# Patient Record
Sex: Male | Born: 1935
Health system: Southern US, Community
[De-identification: ages and names within clinical notes are randomized; demographics above are authoritative.]

---

## 2014-07-19 ENCOUNTER — Other Ambulatory Visit: Payer: Self-pay | Admitting: Urology

## 2014-07-19 DIAGNOSIS — C641 Malignant neoplasm of right kidney, except renal pelvis: Secondary | ICD-10-CM

## 2014-08-01 ENCOUNTER — Inpatient Hospital Stay: Admission: RE | Admit: 2014-08-01 | Payer: Self-pay | Source: Ambulatory Visit

## 2014-08-01 ENCOUNTER — Other Ambulatory Visit: Payer: Self-pay | Admitting: Urology

## 2014-08-01 ENCOUNTER — Ambulatory Visit
Admission: RE | Admit: 2014-08-01 | Discharge: 2014-08-01 | Disposition: A | Discharge status: Home / Self Care | Payer: Medicare Other | Source: Ambulatory Visit | Attending: Urology | Admitting: Urology

## 2014-08-01 DIAGNOSIS — C641 Malignant neoplasm of right kidney, except renal pelvis: Secondary | ICD-10-CM

## 2015-08-05 ENCOUNTER — Other Ambulatory Visit: Payer: Self-pay | Admitting: Urology

## 2015-08-05 DIAGNOSIS — N289 Disorder of kidney and ureter, unspecified: Secondary | ICD-10-CM

## 2015-08-11 ENCOUNTER — Ambulatory Visit
Admission: RE | Admit: 2015-08-11 | Discharge: 2015-08-11 | Disposition: A | Discharge status: Home / Self Care | Payer: Medicare Other | Source: Ambulatory Visit | Attending: Urology | Admitting: Urology

## 2015-08-11 DIAGNOSIS — N289 Disorder of kidney and ureter, unspecified: Secondary | ICD-10-CM

## 2016-03-01 DIAGNOSIS — N184 Chronic kidney disease, stage 4 (severe): Secondary | ICD-10-CM | POA: Diagnosis not present

## 2016-03-01 DIAGNOSIS — Z85528 Personal history of other malignant neoplasm of kidney: Secondary | ICD-10-CM | POA: Diagnosis not present

## 2016-03-01 DIAGNOSIS — G2581 Restless legs syndrome: Secondary | ICD-10-CM | POA: Diagnosis not present

## 2016-03-01 DIAGNOSIS — N401 Enlarged prostate with lower urinary tract symptoms: Secondary | ICD-10-CM | POA: Diagnosis not present

## 2016-03-01 DIAGNOSIS — Z9181 History of falling: Secondary | ICD-10-CM | POA: Diagnosis not present

## 2016-03-01 DIAGNOSIS — I10 Essential (primary) hypertension: Secondary | ICD-10-CM | POA: Diagnosis not present

## 2016-08-05 DIAGNOSIS — N184 Chronic kidney disease, stage 4 (severe): Secondary | ICD-10-CM | POA: Diagnosis not present

## 2016-08-05 DIAGNOSIS — Z0000 Encounter for general adult medical examination without abnormal findings: Secondary | ICD-10-CM | POA: Diagnosis not present

## 2016-08-05 DIAGNOSIS — Z79899 Other long term (current) drug therapy: Secondary | ICD-10-CM | POA: Diagnosis not present

## 2016-08-05 DIAGNOSIS — Z6822 Body mass index (BMI) 22.0-22.9, adult: Secondary | ICD-10-CM | POA: Diagnosis not present

## 2016-08-09 DIAGNOSIS — N3020 Other chronic cystitis without hematuria: Secondary | ICD-10-CM | POA: Diagnosis not present

## 2016-08-09 DIAGNOSIS — N401 Enlarged prostate with lower urinary tract symptoms: Secondary | ICD-10-CM | POA: Diagnosis not present

## 2016-08-09 DIAGNOSIS — N318 Other neuromuscular dysfunction of bladder: Secondary | ICD-10-CM | POA: Diagnosis not present

## 2016-08-09 DIAGNOSIS — R351 Nocturia: Secondary | ICD-10-CM | POA: Diagnosis not present

## 2017-02-16 DIAGNOSIS — J069 Acute upper respiratory infection, unspecified: Secondary | ICD-10-CM | POA: Diagnosis not present

## 2017-02-16 DIAGNOSIS — Z6822 Body mass index (BMI) 22.0-22.9, adult: Secondary | ICD-10-CM | POA: Diagnosis not present

## 2017-02-16 DIAGNOSIS — Z1339 Encounter for screening examination for other mental health and behavioral disorders: Secondary | ICD-10-CM | POA: Diagnosis not present

## 2017-02-25 DIAGNOSIS — E785 Hyperlipidemia, unspecified: Secondary | ICD-10-CM | POA: Diagnosis not present

## 2017-02-25 DIAGNOSIS — Z760 Encounter for issue of repeat prescription: Secondary | ICD-10-CM | POA: Diagnosis not present

## 2017-02-25 DIAGNOSIS — G2581 Restless legs syndrome: Secondary | ICD-10-CM | POA: Diagnosis not present

## 2017-02-25 DIAGNOSIS — Z6822 Body mass index (BMI) 22.0-22.9, adult: Secondary | ICD-10-CM | POA: Diagnosis not present

## 2017-02-25 DIAGNOSIS — I10 Essential (primary) hypertension: Secondary | ICD-10-CM | POA: Diagnosis not present

## 2017-02-25 DIAGNOSIS — N401 Enlarged prostate with lower urinary tract symptoms: Secondary | ICD-10-CM | POA: Diagnosis not present

## 2017-02-25 DIAGNOSIS — Z79899 Other long term (current) drug therapy: Secondary | ICD-10-CM | POA: Diagnosis not present

## 2017-02-25 DIAGNOSIS — N184 Chronic kidney disease, stage 4 (severe): Secondary | ICD-10-CM | POA: Diagnosis not present

## 2017-03-27 DIAGNOSIS — N400 Enlarged prostate without lower urinary tract symptoms: Secondary | ICD-10-CM | POA: Diagnosis not present

## 2017-05-09 DIAGNOSIS — N401 Enlarged prostate with lower urinary tract symptoms: Secondary | ICD-10-CM | POA: Diagnosis not present

## 2017-05-09 DIAGNOSIS — G2581 Restless legs syndrome: Secondary | ICD-10-CM | POA: Diagnosis not present

## 2017-05-09 DIAGNOSIS — Z9181 History of falling: Secondary | ICD-10-CM | POA: Diagnosis not present

## 2017-05-09 DIAGNOSIS — Z85528 Personal history of other malignant neoplasm of kidney: Secondary | ICD-10-CM | POA: Diagnosis not present

## 2017-05-09 DIAGNOSIS — N184 Chronic kidney disease, stage 4 (severe): Secondary | ICD-10-CM | POA: Diagnosis not present

## 2017-05-09 DIAGNOSIS — Z1331 Encounter for screening for depression: Secondary | ICD-10-CM | POA: Diagnosis not present

## 2017-05-09 DIAGNOSIS — I10 Essential (primary) hypertension: Secondary | ICD-10-CM | POA: Diagnosis not present

## 2017-08-09 DIAGNOSIS — R351 Nocturia: Secondary | ICD-10-CM | POA: Diagnosis not present

## 2017-08-09 DIAGNOSIS — N318 Other neuromuscular dysfunction of bladder: Secondary | ICD-10-CM | POA: Diagnosis not present

## 2017-08-09 DIAGNOSIS — N3020 Other chronic cystitis without hematuria: Secondary | ICD-10-CM | POA: Diagnosis not present

## 2017-08-09 DIAGNOSIS — N401 Enlarged prostate with lower urinary tract symptoms: Secondary | ICD-10-CM | POA: Diagnosis not present

## 2017-08-25 DIAGNOSIS — R351 Nocturia: Secondary | ICD-10-CM | POA: Diagnosis not present

## 2017-08-25 DIAGNOSIS — N401 Enlarged prostate with lower urinary tract symptoms: Secondary | ICD-10-CM | POA: Diagnosis not present

## 2017-09-08 DIAGNOSIS — N401 Enlarged prostate with lower urinary tract symptoms: Secondary | ICD-10-CM | POA: Diagnosis not present

## 2017-09-08 DIAGNOSIS — R351 Nocturia: Secondary | ICD-10-CM | POA: Diagnosis not present

## 2017-09-08 DIAGNOSIS — N318 Other neuromuscular dysfunction of bladder: Secondary | ICD-10-CM | POA: Diagnosis not present

## 2017-09-08 DIAGNOSIS — N3020 Other chronic cystitis without hematuria: Secondary | ICD-10-CM | POA: Diagnosis not present

## 2017-10-10 IMAGING — MR MR ABDOMEN W/O CM
8 series · 48 of 48 positions shown · non-contrast
Comparison: MRI of the abdomen 08/01/2014.

CLINICAL DATA: 88-year-old male with history of right renal lesion.
Prior history of left-sided renal cell carcinoma status post left
nephrectomy in April 2013.

EXAM:
MRI ABDOMEN WITHOUT CONTRAST
TECHNIQUE: Multiplanar multisequence MR imaging was performed without the
administration of intravenous contrast.

[Series 3: T2 · coronal · 5.0mm · 1.56mm/px · 3 of 34 slices shown (1 of 3)]
[im 1/34]
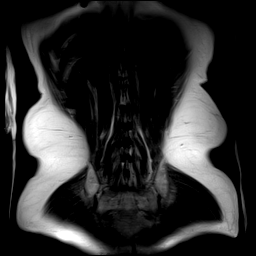
[im 17/34]
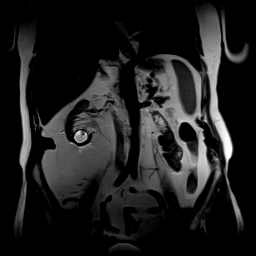
[im 34/34]
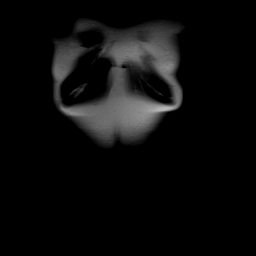

[Series 5: T2 · axial · 5.0mm · 1.48mm/px · z∈[-21,+177]mm · 3 of 34 slices shown (2 of 3)]
[im 1/34]
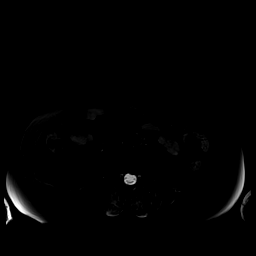
[im 17/34]
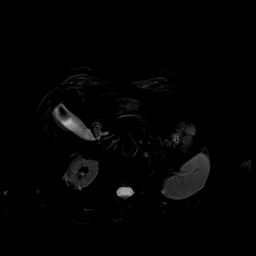
[im 34/34]
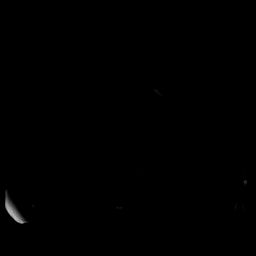

[Series 6: DWI · axial · 5.0mm · 1.42mm/px · z∈[-35,+187]mm · 11 of 114 slices shown (1 of 2)]
[im 1/114]
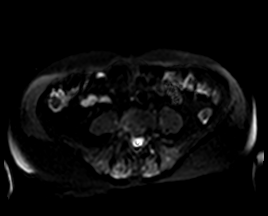
[im 12/114]
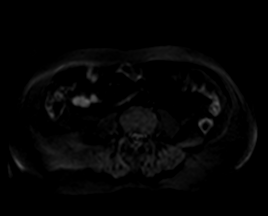
[im 23/114]
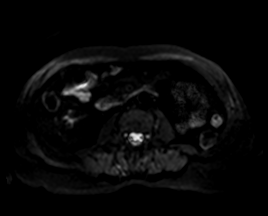
[im 34/114]
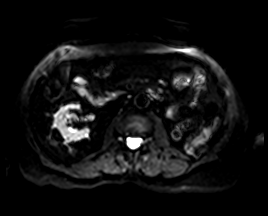
[im 46/114]
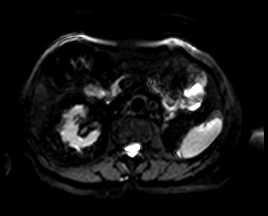
[im 57/114]
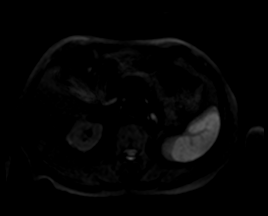
[im 68/114]
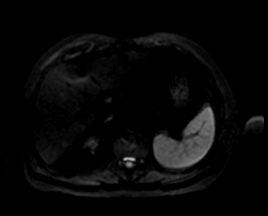
[im 80/114]
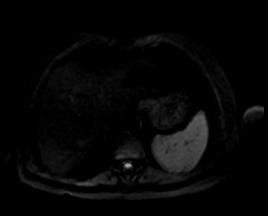
[im 91/114]
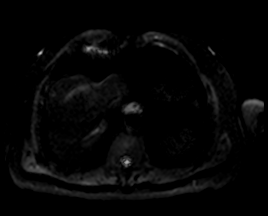
[im 102/114]
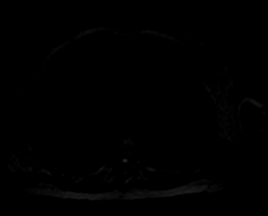
[im 114/114]
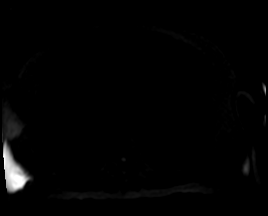

[Series 7: DWI · axial · 5.0mm · 1.42mm/px · z∈[-35,+187]mm · 4 of 38 slices shown (2 of 2)]
[im 1/38]
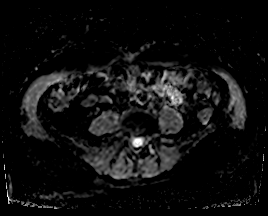
[im 13/38]
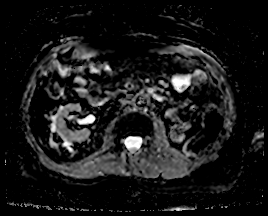
[im 25/38]
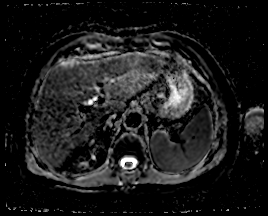
[im 38/38]
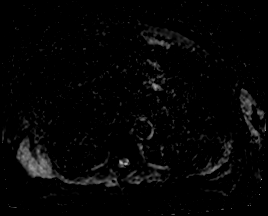

[Series 8: T2 · axial · 6.0mm · 1.22mm/px · z∈[-42,+167]mm · 3 of 30 slices shown (3 of 3)]
[im 1/30]
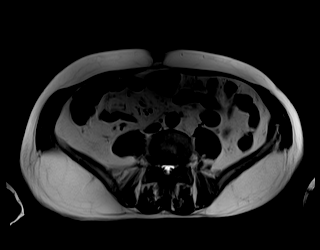
[im 15/30]
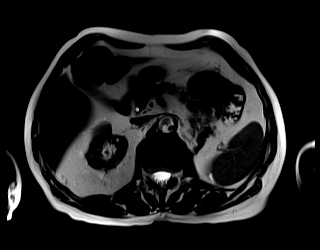
[im 30/30]
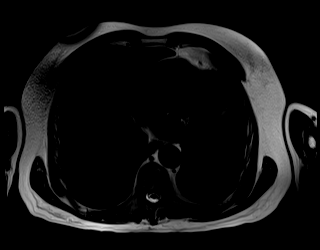

[Series 10: bSSFP · axial · 5.0mm · 1.25mm/px · z∈[-43,+179]mm · 4 of 38 slices shown]
[im 1/38]
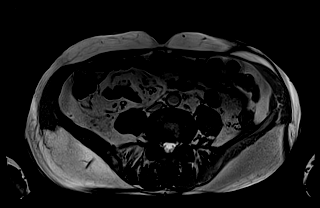
[im 13/38]
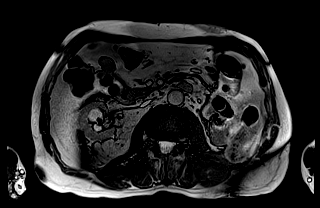
[im 25/38]
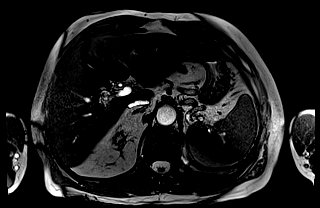
[im 38/38]
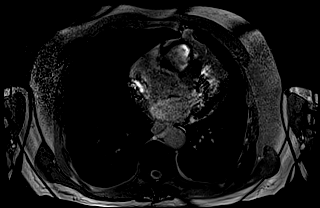

[Series 11: T1 dynamic · axial · non-contrast · 3.0mm · 1.25mm/px · z∈[-44,+169]mm · 7 of 72 slices shown]
[im 1/72]
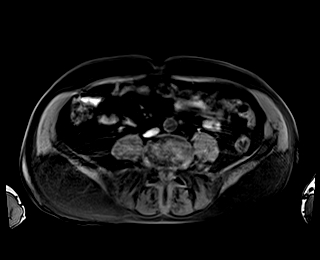
[im 12/72]
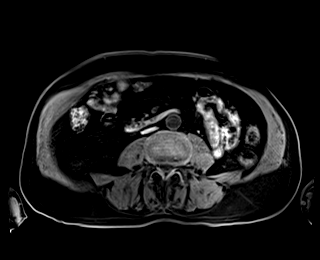
[im 24/72]
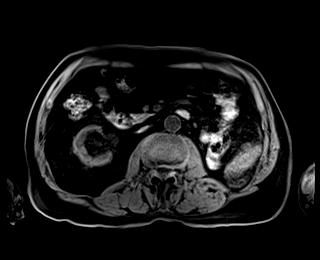
[im 36/72]
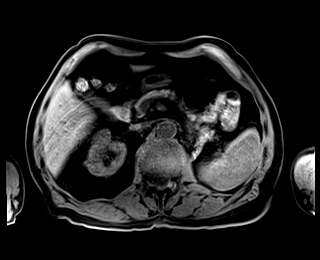
[im 48/72]
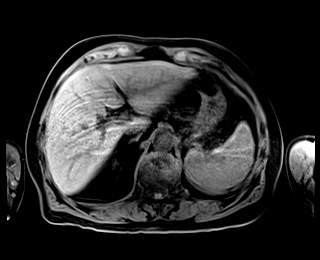
[im 60/72]
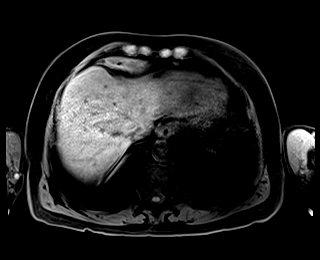
[im 72/72]
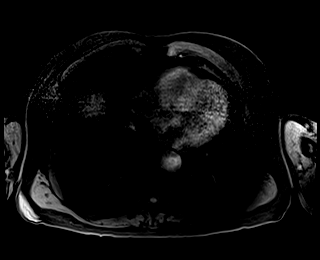

[Series 12: T1 · axial · 3.0mm · 1.12mm/px · z∈[-27,+162]mm · 13 of 128 slices shown]
[im 1/128]
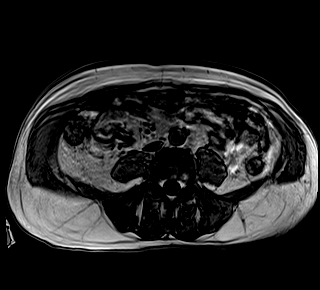
[im 11/128]
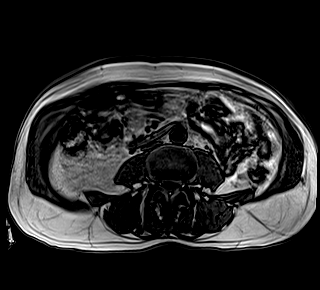
[im 22/128]
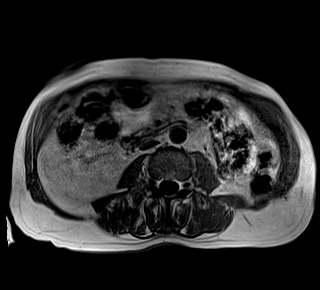
[im 32/128]
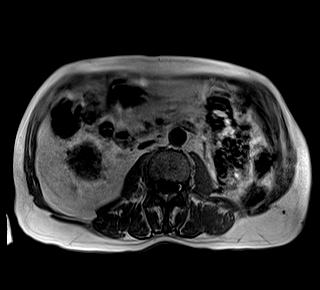
[im 43/128]
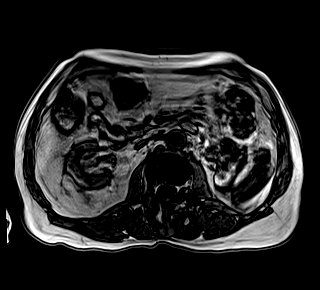
[im 53/128]
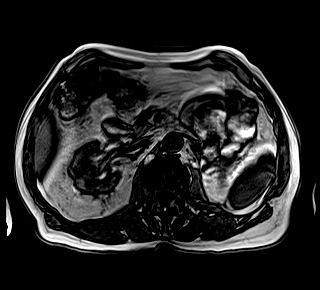
[im 64/128]
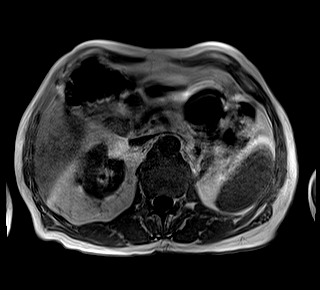
[im 75/128]
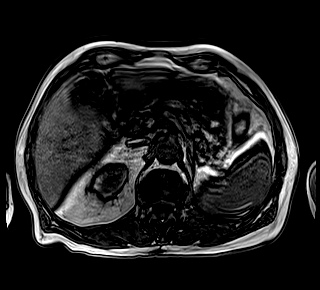
[im 85/128]
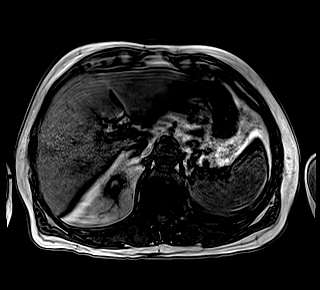
[im 96/128]
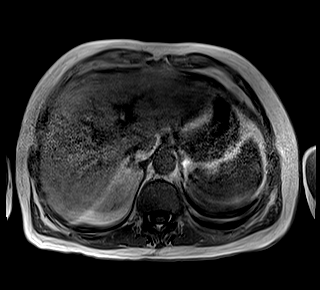
[im 106/128]
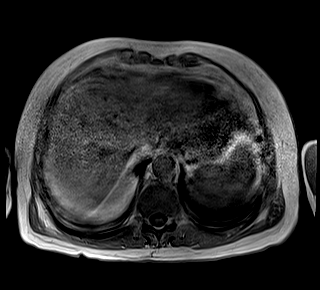
[im 117/128]
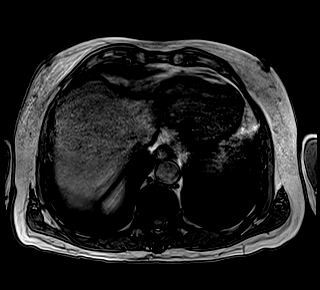
[im 128/128]
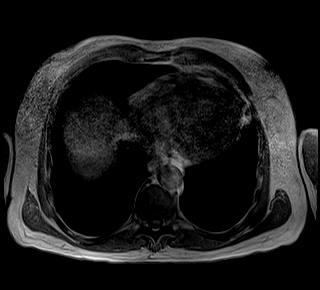

[48 of 48 positions shown; findings below may reference images not displayed]

FINDINGS: Comment: Today's study is limited for detection and characterization
of visceral and/or vascular lesions by lack of IV gadolinium.

Lower chest:  Visualized portions are unremarkable.

Hepatobiliary: No definite cystic or solid hepatic lesions are
identified on today's noncontrast examination. No intra or
extrahepatic biliary ductal dilatation. Gallbladder is normal in
appearance.

Pancreas: No definite pancreatic mass or peripancreatic inflammatory
changes on today's noncontrast MRI examination.

Spleen: Unremarkable.

Adrenals/Urinary Tract: Status post left nephrectomy. No abnormal
soft tissue mass noted in the left nephrectomy bed to suggest local
recurrence of disease. The lesion of concern in the interpolar
region of the right kidney is stable in size measuring 12 mm, and
remains isointense on T2 weighted images and hyperintense on T1
weighted images, presumably a proteinaceous/hemorrhagic cyst. There
is an additional complex cystic lesion in the lower pole of the
right kidney which currently measures 3.3 x 2.1 x 2.0 cm (image 22
of series 8 and image 17 of series 3), which has an internal
septation, as well as some internal nodularity which measures 10 mm
(image 21 of series 8). Overall, this lesion is stable in size and
appearance compared to the prior study. No right-sided
hydroureteronephrosis in the visualized abdomen. Bilateral adrenal
glands are normal in appearance.

Stomach/Bowel: Visualized portions are unremarkable.

Vascular/Lymphatic: No aneurysm identified in the visualized
abdominal vasculature. No lymphadenopathy noted in the abdomen.

Other: No significant volume of ascites in the visualized peritoneal
cavity.

Musculoskeletal: No definite aggressive osseous lesions are
identified in the visualized portions of the abdomen.
IMPRESSION: 1. Previously identified right renal lesions are stable in size and
appearance compared to the prior examination 08/01/2014, favored to
be benign, as detailed above. This includes a
proteinaceous/hemorrhagic cyst in the interpolar region, and a
complex cystic lesion in the lower pole.
2. Status post left nephrectomy. No findings to suggest local
recurrence of disease.

## 2018-01-07 DIAGNOSIS — I10 Essential (primary) hypertension: Secondary | ICD-10-CM | POA: Diagnosis not present

## 2018-01-07 DIAGNOSIS — N184 Chronic kidney disease, stage 4 (severe): Secondary | ICD-10-CM | POA: Diagnosis not present

## 2018-01-07 DIAGNOSIS — E785 Hyperlipidemia, unspecified: Secondary | ICD-10-CM | POA: Diagnosis not present

## 2018-02-07 DIAGNOSIS — I10 Essential (primary) hypertension: Secondary | ICD-10-CM | POA: Diagnosis not present

## 2018-02-07 DIAGNOSIS — E785 Hyperlipidemia, unspecified: Secondary | ICD-10-CM | POA: Diagnosis not present

## 2018-03-09 DIAGNOSIS — N184 Chronic kidney disease, stage 4 (severe): Secondary | ICD-10-CM | POA: Diagnosis not present

## 2018-03-09 DIAGNOSIS — E785 Hyperlipidemia, unspecified: Secondary | ICD-10-CM | POA: Diagnosis not present

## 2018-03-09 DIAGNOSIS — I10 Essential (primary) hypertension: Secondary | ICD-10-CM | POA: Diagnosis not present

## 2018-04-07 DIAGNOSIS — N401 Enlarged prostate with lower urinary tract symptoms: Secondary | ICD-10-CM | POA: Diagnosis not present

## 2018-04-07 DIAGNOSIS — N3020 Other chronic cystitis without hematuria: Secondary | ICD-10-CM | POA: Diagnosis not present

## 2018-04-07 DIAGNOSIS — R351 Nocturia: Secondary | ICD-10-CM | POA: Diagnosis not present

## 2018-04-07 DIAGNOSIS — N318 Other neuromuscular dysfunction of bladder: Secondary | ICD-10-CM | POA: Diagnosis not present

## 2018-04-08 DIAGNOSIS — E785 Hyperlipidemia, unspecified: Secondary | ICD-10-CM | POA: Diagnosis not present

## 2018-04-08 DIAGNOSIS — I10 Essential (primary) hypertension: Secondary | ICD-10-CM | POA: Diagnosis not present

## 2018-05-02 DIAGNOSIS — J0100 Acute maxillary sinusitis, unspecified: Secondary | ICD-10-CM | POA: Diagnosis not present

## 2018-05-02 DIAGNOSIS — J209 Acute bronchitis, unspecified: Secondary | ICD-10-CM | POA: Diagnosis not present

## 2018-05-09 DIAGNOSIS — I10 Essential (primary) hypertension: Secondary | ICD-10-CM | POA: Diagnosis not present

## 2018-05-09 DIAGNOSIS — E785 Hyperlipidemia, unspecified: Secondary | ICD-10-CM | POA: Diagnosis not present

## 2018-06-15 DIAGNOSIS — N184 Chronic kidney disease, stage 4 (severe): Secondary | ICD-10-CM | POA: Diagnosis not present

## 2018-06-15 DIAGNOSIS — N401 Enlarged prostate with lower urinary tract symptoms: Secondary | ICD-10-CM | POA: Diagnosis not present

## 2018-06-15 DIAGNOSIS — Z6821 Body mass index (BMI) 21.0-21.9, adult: Secondary | ICD-10-CM | POA: Diagnosis not present

## 2018-06-26 DIAGNOSIS — K2970 Gastritis, unspecified, without bleeding: Secondary | ICD-10-CM | POA: Diagnosis not present

## 2018-06-26 DIAGNOSIS — Z6821 Body mass index (BMI) 21.0-21.9, adult: Secondary | ICD-10-CM | POA: Diagnosis not present

## 2018-06-26 DIAGNOSIS — Z85528 Personal history of other malignant neoplasm of kidney: Secondary | ICD-10-CM | POA: Diagnosis not present

## 2018-06-26 DIAGNOSIS — N184 Chronic kidney disease, stage 4 (severe): Secondary | ICD-10-CM | POA: Diagnosis not present

## 2018-06-26 DIAGNOSIS — I10 Essential (primary) hypertension: Secondary | ICD-10-CM | POA: Diagnosis not present

## 2018-07-11 DIAGNOSIS — I10 Essential (primary) hypertension: Secondary | ICD-10-CM | POA: Diagnosis not present

## 2018-07-11 DIAGNOSIS — I951 Orthostatic hypotension: Secondary | ICD-10-CM | POA: Diagnosis not present

## 2018-07-11 DIAGNOSIS — R51 Headache: Secondary | ICD-10-CM | POA: Diagnosis not present

## 2018-07-11 DIAGNOSIS — M542 Cervicalgia: Secondary | ICD-10-CM | POA: Diagnosis not present

## 2018-07-11 DIAGNOSIS — R296 Repeated falls: Secondary | ICD-10-CM | POA: Diagnosis not present

## 2018-07-18 DIAGNOSIS — I700 Atherosclerosis of aorta: Secondary | ICD-10-CM | POA: Diagnosis not present

## 2018-07-18 DIAGNOSIS — I10 Essential (primary) hypertension: Secondary | ICD-10-CM | POA: Diagnosis not present

## 2018-07-18 DIAGNOSIS — D692 Other nonthrombocytopenic purpura: Secondary | ICD-10-CM | POA: Diagnosis not present

## 2018-07-18 DIAGNOSIS — K219 Gastro-esophageal reflux disease without esophagitis: Secondary | ICD-10-CM | POA: Diagnosis not present

## 2018-07-18 DIAGNOSIS — Z6821 Body mass index (BMI) 21.0-21.9, adult: Secondary | ICD-10-CM | POA: Diagnosis not present

## 2018-07-18 DIAGNOSIS — Z0000 Encounter for general adult medical examination without abnormal findings: Secondary | ICD-10-CM | POA: Diagnosis not present

## 2018-07-18 DIAGNOSIS — N401 Enlarged prostate with lower urinary tract symptoms: Secondary | ICD-10-CM | POA: Diagnosis not present

## 2018-07-18 DIAGNOSIS — Z85528 Personal history of other malignant neoplasm of kidney: Secondary | ICD-10-CM | POA: Diagnosis not present

## 2018-07-18 DIAGNOSIS — G2581 Restless legs syndrome: Secondary | ICD-10-CM | POA: Diagnosis not present

## 2018-07-18 DIAGNOSIS — N184 Chronic kidney disease, stage 4 (severe): Secondary | ICD-10-CM | POA: Diagnosis not present

## 2018-07-18 DIAGNOSIS — E785 Hyperlipidemia, unspecified: Secondary | ICD-10-CM | POA: Diagnosis not present

## 2018-08-15 DIAGNOSIS — Z6821 Body mass index (BMI) 21.0-21.9, adult: Secondary | ICD-10-CM | POA: Diagnosis not present

## 2018-08-15 DIAGNOSIS — N184 Chronic kidney disease, stage 4 (severe): Secondary | ICD-10-CM | POA: Diagnosis not present

## 2018-08-15 DIAGNOSIS — R404 Transient alteration of awareness: Secondary | ICD-10-CM | POA: Diagnosis not present

## 2018-08-15 DIAGNOSIS — R4181 Age-related cognitive decline: Secondary | ICD-10-CM | POA: Diagnosis not present

## 2018-08-15 DIAGNOSIS — R64 Cachexia: Secondary | ICD-10-CM | POA: Diagnosis not present

## 2018-08-15 DIAGNOSIS — I10 Essential (primary) hypertension: Secondary | ICD-10-CM | POA: Diagnosis not present

## 2018-08-15 DIAGNOSIS — R739 Hyperglycemia, unspecified: Secondary | ICD-10-CM | POA: Diagnosis not present

## 2018-08-15 DIAGNOSIS — R4586 Emotional lability: Secondary | ICD-10-CM | POA: Diagnosis not present

## 2018-11-08 DIAGNOSIS — K219 Gastro-esophageal reflux disease without esophagitis: Secondary | ICD-10-CM | POA: Diagnosis not present

## 2018-11-08 DIAGNOSIS — I10 Essential (primary) hypertension: Secondary | ICD-10-CM | POA: Diagnosis not present

## 2018-11-08 DIAGNOSIS — E785 Hyperlipidemia, unspecified: Secondary | ICD-10-CM | POA: Diagnosis not present

## 2018-11-08 DIAGNOSIS — N184 Chronic kidney disease, stage 4 (severe): Secondary | ICD-10-CM | POA: Diagnosis not present

## 2018-11-29 DIAGNOSIS — Z23 Encounter for immunization: Secondary | ICD-10-CM | POA: Diagnosis not present

## 2019-04-06 DIAGNOSIS — Z7689 Persons encountering health services in other specified circumstances: Secondary | ICD-10-CM | POA: Diagnosis not present

## 2019-05-09 DIAGNOSIS — E785 Hyperlipidemia, unspecified: Secondary | ICD-10-CM | POA: Diagnosis not present

## 2019-05-09 DIAGNOSIS — I10 Essential (primary) hypertension: Secondary | ICD-10-CM | POA: Diagnosis not present

## 2019-06-11 DIAGNOSIS — R739 Hyperglycemia, unspecified: Secondary | ICD-10-CM | POA: Diagnosis not present

## 2019-06-11 DIAGNOSIS — Z79899 Other long term (current) drug therapy: Secondary | ICD-10-CM | POA: Diagnosis not present

## 2019-06-11 DIAGNOSIS — E785 Hyperlipidemia, unspecified: Secondary | ICD-10-CM | POA: Diagnosis not present

## 2019-06-18 DIAGNOSIS — R4181 Age-related cognitive decline: Secondary | ICD-10-CM | POA: Diagnosis not present

## 2019-06-18 DIAGNOSIS — I700 Atherosclerosis of aorta: Secondary | ICD-10-CM | POA: Diagnosis not present

## 2019-06-18 DIAGNOSIS — Z85528 Personal history of other malignant neoplasm of kidney: Secondary | ICD-10-CM | POA: Diagnosis not present

## 2019-06-18 DIAGNOSIS — D692 Other nonthrombocytopenic purpura: Secondary | ICD-10-CM | POA: Diagnosis not present

## 2019-06-18 DIAGNOSIS — R4586 Emotional lability: Secondary | ICD-10-CM | POA: Diagnosis not present

## 2019-06-18 DIAGNOSIS — N184 Chronic kidney disease, stage 4 (severe): Secondary | ICD-10-CM | POA: Diagnosis not present

## 2019-06-18 DIAGNOSIS — R64 Cachexia: Secondary | ICD-10-CM | POA: Diagnosis not present

## 2019-06-18 DIAGNOSIS — I10 Essential (primary) hypertension: Secondary | ICD-10-CM | POA: Diagnosis not present

## 2019-06-18 DIAGNOSIS — E785 Hyperlipidemia, unspecified: Secondary | ICD-10-CM | POA: Diagnosis not present

## 2019-06-18 DIAGNOSIS — G2581 Restless legs syndrome: Secondary | ICD-10-CM | POA: Diagnosis not present

## 2019-06-18 DIAGNOSIS — N401 Enlarged prostate with lower urinary tract symptoms: Secondary | ICD-10-CM | POA: Diagnosis not present

## 2019-06-18 DIAGNOSIS — K219 Gastro-esophageal reflux disease without esophagitis: Secondary | ICD-10-CM | POA: Diagnosis not present

## 2019-07-15 DIAGNOSIS — R0902 Hypoxemia: Secondary | ICD-10-CM | POA: Diagnosis not present

## 2019-07-15 DIAGNOSIS — Z66 Do not resuscitate: Secondary | ICD-10-CM | POA: Diagnosis not present

## 2019-07-15 DIAGNOSIS — K5712 Diverticulitis of small intestine without perforation or abscess without bleeding: Secondary | ICD-10-CM | POA: Diagnosis not present

## 2019-07-15 DIAGNOSIS — R1084 Generalized abdominal pain: Secondary | ICD-10-CM | POA: Diagnosis not present

## 2019-07-15 DIAGNOSIS — R52 Pain, unspecified: Secondary | ICD-10-CM | POA: Diagnosis not present

## 2019-07-15 DIAGNOSIS — R110 Nausea: Secondary | ICD-10-CM | POA: Diagnosis not present

## 2019-07-15 DIAGNOSIS — I4891 Unspecified atrial fibrillation: Secondary | ICD-10-CM | POA: Diagnosis not present

## 2019-07-15 DIAGNOSIS — E871 Hypo-osmolality and hyponatremia: Secondary | ICD-10-CM | POA: Diagnosis not present

## 2019-07-15 DIAGNOSIS — R1031 Right lower quadrant pain: Secondary | ICD-10-CM | POA: Diagnosis not present

## 2019-07-15 DIAGNOSIS — Z79899 Other long term (current) drug therapy: Secondary | ICD-10-CM | POA: Diagnosis not present

## 2019-07-15 DIAGNOSIS — N183 Chronic kidney disease, stage 3 unspecified: Secondary | ICD-10-CM | POA: Diagnosis not present

## 2019-07-15 DIAGNOSIS — A419 Sepsis, unspecified organism: Secondary | ICD-10-CM | POA: Diagnosis not present

## 2019-07-15 DIAGNOSIS — N400 Enlarged prostate without lower urinary tract symptoms: Secondary | ICD-10-CM | POA: Diagnosis not present

## 2019-07-15 DIAGNOSIS — R739 Hyperglycemia, unspecified: Secondary | ICD-10-CM | POA: Diagnosis not present

## 2019-07-15 DIAGNOSIS — M1990 Unspecified osteoarthritis, unspecified site: Secondary | ICD-10-CM | POA: Diagnosis not present

## 2019-07-15 DIAGNOSIS — F0390 Unspecified dementia without behavioral disturbance: Secondary | ICD-10-CM | POA: Diagnosis not present

## 2019-07-15 DIAGNOSIS — R109 Unspecified abdominal pain: Secondary | ICD-10-CM | POA: Diagnosis not present

## 2019-07-15 DIAGNOSIS — I10 Essential (primary) hypertension: Secondary | ICD-10-CM | POA: Diagnosis not present

## 2019-07-15 DIAGNOSIS — I129 Hypertensive chronic kidney disease with stage 1 through stage 4 chronic kidney disease, or unspecified chronic kidney disease: Secondary | ICD-10-CM | POA: Diagnosis not present

## 2019-07-15 DIAGNOSIS — R2681 Unsteadiness on feet: Secondary | ICD-10-CM | POA: Diagnosis not present

## 2019-07-16 DIAGNOSIS — A419 Sepsis, unspecified organism: Secondary | ICD-10-CM

## 2019-07-25 DIAGNOSIS — N401 Enlarged prostate with lower urinary tract symptoms: Secondary | ICD-10-CM | POA: Diagnosis not present

## 2019-07-25 DIAGNOSIS — M791 Myalgia, unspecified site: Secondary | ICD-10-CM | POA: Diagnosis not present

## 2019-07-25 DIAGNOSIS — I10 Essential (primary) hypertension: Secondary | ICD-10-CM | POA: Diagnosis not present

## 2019-07-25 DIAGNOSIS — Z6821 Body mass index (BMI) 21.0-21.9, adult: Secondary | ICD-10-CM | POA: Diagnosis not present

## 2019-07-25 DIAGNOSIS — F0390 Unspecified dementia without behavioral disturbance: Secondary | ICD-10-CM | POA: Diagnosis not present

## 2019-07-25 DIAGNOSIS — T466X5A Adverse effect of antihyperlipidemic and antiarteriosclerotic drugs, initial encounter: Secondary | ICD-10-CM | POA: Diagnosis not present

## 2019-07-25 DIAGNOSIS — D692 Other nonthrombocytopenic purpura: Secondary | ICD-10-CM | POA: Diagnosis not present

## 2019-07-25 DIAGNOSIS — N184 Chronic kidney disease, stage 4 (severe): Secondary | ICD-10-CM | POA: Diagnosis not present

## 2019-07-25 DIAGNOSIS — K5712 Diverticulitis of small intestine without perforation or abscess without bleeding: Secondary | ICD-10-CM | POA: Diagnosis not present

## 2019-07-25 DIAGNOSIS — E785 Hyperlipidemia, unspecified: Secondary | ICD-10-CM | POA: Diagnosis not present

## 2019-09-07 DIAGNOSIS — Z0000 Encounter for general adult medical examination without abnormal findings: Secondary | ICD-10-CM | POA: Diagnosis not present

## 2019-09-07 DIAGNOSIS — I10 Essential (primary) hypertension: Secondary | ICD-10-CM | POA: Diagnosis not present

## 2019-09-07 DIAGNOSIS — T466X5A Adverse effect of antihyperlipidemic and antiarteriosclerotic drugs, initial encounter: Secondary | ICD-10-CM | POA: Diagnosis not present

## 2019-09-07 DIAGNOSIS — I700 Atherosclerosis of aorta: Secondary | ICD-10-CM | POA: Diagnosis not present

## 2019-09-07 DIAGNOSIS — R64 Cachexia: Secondary | ICD-10-CM | POA: Diagnosis not present

## 2019-09-07 DIAGNOSIS — F0390 Unspecified dementia without behavioral disturbance: Secondary | ICD-10-CM | POA: Diagnosis not present

## 2019-09-07 DIAGNOSIS — Z79899 Other long term (current) drug therapy: Secondary | ICD-10-CM | POA: Diagnosis not present

## 2019-09-07 DIAGNOSIS — G2581 Restless legs syndrome: Secondary | ICD-10-CM | POA: Diagnosis not present

## 2019-09-07 DIAGNOSIS — M791 Myalgia, unspecified site: Secondary | ICD-10-CM | POA: Diagnosis not present

## 2019-09-07 DIAGNOSIS — N184 Chronic kidney disease, stage 4 (severe): Secondary | ICD-10-CM | POA: Diagnosis not present

## 2019-09-07 DIAGNOSIS — Z85528 Personal history of other malignant neoplasm of kidney: Secondary | ICD-10-CM | POA: Diagnosis not present

## 2019-09-07 DIAGNOSIS — E785 Hyperlipidemia, unspecified: Secondary | ICD-10-CM | POA: Diagnosis not present

## 2019-09-21 DIAGNOSIS — I499 Cardiac arrhythmia, unspecified: Secondary | ICD-10-CM | POA: Diagnosis not present

## 2019-09-21 DIAGNOSIS — R1310 Dysphagia, unspecified: Secondary | ICD-10-CM | POA: Diagnosis not present

## 2019-09-21 DIAGNOSIS — I4891 Unspecified atrial fibrillation: Secondary | ICD-10-CM | POA: Diagnosis not present

## 2019-09-21 DIAGNOSIS — I129 Hypertensive chronic kidney disease with stage 1 through stage 4 chronic kidney disease, or unspecified chronic kidney disease: Secondary | ICD-10-CM | POA: Diagnosis not present

## 2019-09-21 DIAGNOSIS — R9431 Abnormal electrocardiogram [ECG] [EKG]: Secondary | ICD-10-CM | POA: Diagnosis not present

## 2019-09-21 DIAGNOSIS — I700 Atherosclerosis of aorta: Secondary | ICD-10-CM | POA: Diagnosis not present

## 2019-09-21 DIAGNOSIS — I517 Cardiomegaly: Secondary | ICD-10-CM | POA: Diagnosis not present

## 2019-09-21 DIAGNOSIS — Z905 Acquired absence of kidney: Secondary | ICD-10-CM | POA: Diagnosis not present

## 2019-09-21 DIAGNOSIS — Z8701 Personal history of pneumonia (recurrent): Secondary | ICD-10-CM | POA: Diagnosis not present

## 2019-09-21 DIAGNOSIS — I420 Dilated cardiomyopathy: Secondary | ICD-10-CM | POA: Diagnosis not present

## 2019-09-21 DIAGNOSIS — I1310 Hypertensive heart and chronic kidney disease without heart failure, with stage 1 through stage 4 chronic kidney disease, or unspecified chronic kidney disease: Secondary | ICD-10-CM | POA: Diagnosis not present

## 2019-09-21 DIAGNOSIS — N189 Chronic kidney disease, unspecified: Secondary | ICD-10-CM | POA: Diagnosis not present

## 2019-09-21 DIAGNOSIS — I482 Chronic atrial fibrillation, unspecified: Secondary | ICD-10-CM | POA: Diagnosis not present

## 2019-09-21 DIAGNOSIS — R0600 Dyspnea, unspecified: Secondary | ICD-10-CM | POA: Diagnosis not present

## 2019-09-21 DIAGNOSIS — J189 Pneumonia, unspecified organism: Secondary | ICD-10-CM | POA: Diagnosis not present

## 2019-09-21 DIAGNOSIS — I4819 Other persistent atrial fibrillation: Secondary | ICD-10-CM | POA: Diagnosis not present

## 2019-09-21 DIAGNOSIS — N184 Chronic kidney disease, stage 4 (severe): Secondary | ICD-10-CM | POA: Diagnosis not present

## 2019-09-21 DIAGNOSIS — R0789 Other chest pain: Secondary | ICD-10-CM | POA: Diagnosis not present

## 2019-09-21 DIAGNOSIS — R338 Other retention of urine: Secondary | ICD-10-CM | POA: Diagnosis not present

## 2019-09-21 DIAGNOSIS — E860 Dehydration: Secondary | ICD-10-CM | POA: Diagnosis not present

## 2019-09-21 DIAGNOSIS — R197 Diarrhea, unspecified: Secondary | ICD-10-CM | POA: Diagnosis not present

## 2019-09-21 DIAGNOSIS — N390 Urinary tract infection, site not specified: Secondary | ICD-10-CM | POA: Diagnosis not present

## 2019-09-21 DIAGNOSIS — Z85528 Personal history of other malignant neoplasm of kidney: Secondary | ICD-10-CM | POA: Diagnosis not present

## 2019-09-21 DIAGNOSIS — N289 Disorder of kidney and ureter, unspecified: Secondary | ICD-10-CM | POA: Diagnosis not present

## 2019-09-21 DIAGNOSIS — F0391 Unspecified dementia with behavioral disturbance: Secondary | ICD-10-CM | POA: Diagnosis not present

## 2019-09-21 DIAGNOSIS — E872 Acidosis: Secondary | ICD-10-CM | POA: Diagnosis not present

## 2019-09-21 DIAGNOSIS — K5790 Diverticulosis of intestine, part unspecified, without perforation or abscess without bleeding: Secondary | ICD-10-CM | POA: Diagnosis not present

## 2019-09-21 DIAGNOSIS — D649 Anemia, unspecified: Secondary | ICD-10-CM | POA: Diagnosis not present

## 2019-09-21 DIAGNOSIS — Z9889 Other specified postprocedural states: Secondary | ICD-10-CM | POA: Diagnosis not present

## 2019-09-21 DIAGNOSIS — Z79899 Other long term (current) drug therapy: Secondary | ICD-10-CM | POA: Diagnosis not present

## 2019-09-21 DIAGNOSIS — D631 Anemia in chronic kidney disease: Secondary | ICD-10-CM | POA: Diagnosis not present

## 2019-09-21 DIAGNOSIS — N281 Cyst of kidney, acquired: Secondary | ICD-10-CM | POA: Diagnosis not present

## 2019-09-21 DIAGNOSIS — F0390 Unspecified dementia without behavioral disturbance: Secondary | ICD-10-CM | POA: Diagnosis not present

## 2019-09-21 DIAGNOSIS — R911 Solitary pulmonary nodule: Secondary | ICD-10-CM | POA: Diagnosis not present

## 2019-09-21 DIAGNOSIS — Z20828 Contact with and (suspected) exposure to other viral communicable diseases: Secondary | ICD-10-CM | POA: Diagnosis not present

## 2019-09-21 DIAGNOSIS — F05 Delirium due to known physiological condition: Secondary | ICD-10-CM | POA: Diagnosis not present

## 2019-09-21 DIAGNOSIS — Z7902 Long term (current) use of antithrombotics/antiplatelets: Secondary | ICD-10-CM | POA: Diagnosis not present

## 2019-09-21 DIAGNOSIS — R0602 Shortness of breath: Secondary | ICD-10-CM | POA: Diagnosis not present

## 2019-09-21 DIAGNOSIS — N401 Enlarged prostate with lower urinary tract symptoms: Secondary | ICD-10-CM | POA: Diagnosis not present

## 2019-09-21 DIAGNOSIS — I501 Left ventricular failure: Secondary | ICD-10-CM | POA: Diagnosis not present

## 2019-09-21 DIAGNOSIS — Z87442 Personal history of urinary calculi: Secondary | ICD-10-CM | POA: Diagnosis not present

## 2019-09-21 DIAGNOSIS — E785 Hyperlipidemia, unspecified: Secondary | ICD-10-CM | POA: Diagnosis not present

## 2019-09-21 DIAGNOSIS — I10 Essential (primary) hypertension: Secondary | ICD-10-CM | POA: Diagnosis not present

## 2019-09-21 DIAGNOSIS — N179 Acute kidney failure, unspecified: Secondary | ICD-10-CM | POA: Diagnosis not present

## 2019-09-22 DIAGNOSIS — I482 Chronic atrial fibrillation, unspecified: Secondary | ICD-10-CM | POA: Insufficient documentation

## 2019-09-22 DIAGNOSIS — N179 Acute kidney failure, unspecified: Secondary | ICD-10-CM | POA: Diagnosis not present

## 2019-09-22 DIAGNOSIS — R0609 Other forms of dyspnea: Secondary | ICD-10-CM | POA: Insufficient documentation

## 2019-09-22 DIAGNOSIS — N390 Urinary tract infection, site not specified: Secondary | ICD-10-CM | POA: Diagnosis not present

## 2019-09-29 DIAGNOSIS — I493 Ventricular premature depolarization: Secondary | ICD-10-CM | POA: Diagnosis not present

## 2019-09-29 DIAGNOSIS — R531 Weakness: Secondary | ICD-10-CM | POA: Diagnosis not present

## 2019-09-29 DIAGNOSIS — E860 Dehydration: Secondary | ICD-10-CM | POA: Diagnosis not present

## 2019-09-29 DIAGNOSIS — I959 Hypotension, unspecified: Secondary | ICD-10-CM | POA: Diagnosis not present

## 2019-09-29 DIAGNOSIS — R000 Tachycardia, unspecified: Secondary | ICD-10-CM | POA: Diagnosis not present

## 2019-09-29 DIAGNOSIS — R197 Diarrhea, unspecified: Secondary | ICD-10-CM | POA: Diagnosis not present

## 2019-09-29 DIAGNOSIS — R42 Dizziness and giddiness: Secondary | ICD-10-CM | POA: Diagnosis not present

## 2019-09-29 DIAGNOSIS — I4891 Unspecified atrial fibrillation: Secondary | ICD-10-CM | POA: Diagnosis not present

## 2019-10-02 DIAGNOSIS — I493 Ventricular premature depolarization: Secondary | ICD-10-CM | POA: Diagnosis not present

## 2019-10-02 DIAGNOSIS — I4891 Unspecified atrial fibrillation: Secondary | ICD-10-CM | POA: Diagnosis not present

## 2019-10-02 DIAGNOSIS — N184 Chronic kidney disease, stage 4 (severe): Secondary | ICD-10-CM | POA: Diagnosis not present

## 2019-10-02 DIAGNOSIS — I4819 Other persistent atrial fibrillation: Secondary | ICD-10-CM | POA: Diagnosis not present

## 2019-10-02 DIAGNOSIS — Z681 Body mass index (BMI) 19 or less, adult: Secondary | ICD-10-CM | POA: Diagnosis not present

## 2019-10-02 DIAGNOSIS — Z85528 Personal history of other malignant neoplasm of kidney: Secondary | ICD-10-CM | POA: Diagnosis not present

## 2019-10-02 DIAGNOSIS — D6869 Other thrombophilia: Secondary | ICD-10-CM | POA: Diagnosis not present

## 2019-10-02 DIAGNOSIS — E441 Mild protein-calorie malnutrition: Secondary | ICD-10-CM | POA: Diagnosis not present

## 2019-10-02 DIAGNOSIS — I420 Dilated cardiomyopathy: Secondary | ICD-10-CM | POA: Diagnosis not present

## 2019-10-02 DIAGNOSIS — R64 Cachexia: Secondary | ICD-10-CM | POA: Diagnosis not present

## 2019-10-02 DIAGNOSIS — I5020 Unspecified systolic (congestive) heart failure: Secondary | ICD-10-CM | POA: Diagnosis not present

## 2019-10-02 DIAGNOSIS — R112 Nausea with vomiting, unspecified: Secondary | ICD-10-CM | POA: Diagnosis not present

## 2019-10-03 DIAGNOSIS — F0390 Unspecified dementia without behavioral disturbance: Secondary | ICD-10-CM | POA: Diagnosis not present

## 2019-10-03 DIAGNOSIS — Z9181 History of falling: Secondary | ICD-10-CM | POA: Diagnosis not present

## 2019-10-03 DIAGNOSIS — N401 Enlarged prostate with lower urinary tract symptoms: Secondary | ICD-10-CM | POA: Diagnosis not present

## 2019-10-03 DIAGNOSIS — R3914 Feeling of incomplete bladder emptying: Secondary | ICD-10-CM | POA: Diagnosis not present

## 2019-10-03 DIAGNOSIS — R338 Other retention of urine: Secondary | ICD-10-CM | POA: Diagnosis not present

## 2019-10-03 DIAGNOSIS — I129 Hypertensive chronic kidney disease with stage 1 through stage 4 chronic kidney disease, or unspecified chronic kidney disease: Secondary | ICD-10-CM | POA: Diagnosis not present

## 2019-10-03 DIAGNOSIS — I482 Chronic atrial fibrillation, unspecified: Secondary | ICD-10-CM | POA: Diagnosis not present

## 2019-10-03 DIAGNOSIS — N184 Chronic kidney disease, stage 4 (severe): Secondary | ICD-10-CM | POA: Diagnosis not present

## 2019-10-03 DIAGNOSIS — Z905 Acquired absence of kidney: Secondary | ICD-10-CM | POA: Diagnosis not present

## 2019-10-03 DIAGNOSIS — Z7982 Long term (current) use of aspirin: Secondary | ICD-10-CM | POA: Diagnosis not present

## 2019-10-03 DIAGNOSIS — Z85528 Personal history of other malignant neoplasm of kidney: Secondary | ICD-10-CM | POA: Diagnosis not present

## 2019-10-05 DIAGNOSIS — Z7982 Long term (current) use of aspirin: Secondary | ICD-10-CM | POA: Diagnosis not present

## 2019-10-05 DIAGNOSIS — Z905 Acquired absence of kidney: Secondary | ICD-10-CM | POA: Diagnosis not present

## 2019-10-05 DIAGNOSIS — F0390 Unspecified dementia without behavioral disturbance: Secondary | ICD-10-CM | POA: Diagnosis not present

## 2019-10-05 DIAGNOSIS — R3914 Feeling of incomplete bladder emptying: Secondary | ICD-10-CM | POA: Diagnosis not present

## 2019-10-05 DIAGNOSIS — N401 Enlarged prostate with lower urinary tract symptoms: Secondary | ICD-10-CM | POA: Diagnosis not present

## 2019-10-05 DIAGNOSIS — R338 Other retention of urine: Secondary | ICD-10-CM | POA: Diagnosis not present

## 2019-10-05 DIAGNOSIS — Z85528 Personal history of other malignant neoplasm of kidney: Secondary | ICD-10-CM | POA: Diagnosis not present

## 2019-10-05 DIAGNOSIS — I129 Hypertensive chronic kidney disease with stage 1 through stage 4 chronic kidney disease, or unspecified chronic kidney disease: Secondary | ICD-10-CM | POA: Diagnosis not present

## 2019-10-05 DIAGNOSIS — I482 Chronic atrial fibrillation, unspecified: Secondary | ICD-10-CM | POA: Diagnosis not present

## 2019-10-05 DIAGNOSIS — Z9181 History of falling: Secondary | ICD-10-CM | POA: Diagnosis not present

## 2019-10-05 DIAGNOSIS — N184 Chronic kidney disease, stage 4 (severe): Secondary | ICD-10-CM | POA: Diagnosis not present

## 2019-10-08 DIAGNOSIS — I482 Chronic atrial fibrillation, unspecified: Secondary | ICD-10-CM | POA: Diagnosis not present

## 2019-10-08 DIAGNOSIS — Z9181 History of falling: Secondary | ICD-10-CM | POA: Diagnosis not present

## 2019-10-08 DIAGNOSIS — R338 Other retention of urine: Secondary | ICD-10-CM | POA: Diagnosis not present

## 2019-10-08 DIAGNOSIS — Z905 Acquired absence of kidney: Secondary | ICD-10-CM | POA: Diagnosis not present

## 2019-10-08 DIAGNOSIS — N184 Chronic kidney disease, stage 4 (severe): Secondary | ICD-10-CM | POA: Diagnosis not present

## 2019-10-08 DIAGNOSIS — R3914 Feeling of incomplete bladder emptying: Secondary | ICD-10-CM | POA: Diagnosis not present

## 2019-10-08 DIAGNOSIS — N401 Enlarged prostate with lower urinary tract symptoms: Secondary | ICD-10-CM | POA: Diagnosis not present

## 2019-10-08 DIAGNOSIS — F0390 Unspecified dementia without behavioral disturbance: Secondary | ICD-10-CM | POA: Diagnosis not present

## 2019-10-08 DIAGNOSIS — Z7982 Long term (current) use of aspirin: Secondary | ICD-10-CM | POA: Diagnosis not present

## 2019-10-08 DIAGNOSIS — Z85528 Personal history of other malignant neoplasm of kidney: Secondary | ICD-10-CM | POA: Diagnosis not present

## 2019-10-08 DIAGNOSIS — I129 Hypertensive chronic kidney disease with stage 1 through stage 4 chronic kidney disease, or unspecified chronic kidney disease: Secondary | ICD-10-CM | POA: Diagnosis not present

## 2019-10-09 DIAGNOSIS — Z681 Body mass index (BMI) 19 or less, adult: Secondary | ICD-10-CM | POA: Diagnosis not present

## 2019-10-09 DIAGNOSIS — I420 Dilated cardiomyopathy: Secondary | ICD-10-CM | POA: Diagnosis not present

## 2019-10-09 DIAGNOSIS — E441 Mild protein-calorie malnutrition: Secondary | ICD-10-CM | POA: Diagnosis not present

## 2019-10-09 DIAGNOSIS — N184 Chronic kidney disease, stage 4 (severe): Secondary | ICD-10-CM | POA: Diagnosis not present

## 2019-10-09 DIAGNOSIS — I951 Orthostatic hypotension: Secondary | ICD-10-CM | POA: Diagnosis not present

## 2019-10-09 DIAGNOSIS — Y92009 Unspecified place in unspecified non-institutional (private) residence as the place of occurrence of the external cause: Secondary | ICD-10-CM | POA: Diagnosis not present

## 2019-10-09 DIAGNOSIS — I5020 Unspecified systolic (congestive) heart failure: Secondary | ICD-10-CM | POA: Diagnosis not present

## 2019-10-09 DIAGNOSIS — W19XXXA Unspecified fall, initial encounter: Secondary | ICD-10-CM | POA: Diagnosis not present

## 2019-10-12 DIAGNOSIS — N184 Chronic kidney disease, stage 4 (severe): Secondary | ICD-10-CM | POA: Diagnosis not present

## 2019-10-12 DIAGNOSIS — Z9181 History of falling: Secondary | ICD-10-CM | POA: Diagnosis not present

## 2019-10-12 DIAGNOSIS — Z7982 Long term (current) use of aspirin: Secondary | ICD-10-CM | POA: Diagnosis not present

## 2019-10-12 DIAGNOSIS — R338 Other retention of urine: Secondary | ICD-10-CM | POA: Diagnosis not present

## 2019-10-12 DIAGNOSIS — I482 Chronic atrial fibrillation, unspecified: Secondary | ICD-10-CM | POA: Diagnosis not present

## 2019-10-12 DIAGNOSIS — Z85528 Personal history of other malignant neoplasm of kidney: Secondary | ICD-10-CM | POA: Diagnosis not present

## 2019-10-12 DIAGNOSIS — F0390 Unspecified dementia without behavioral disturbance: Secondary | ICD-10-CM | POA: Diagnosis not present

## 2019-10-12 DIAGNOSIS — Z905 Acquired absence of kidney: Secondary | ICD-10-CM | POA: Diagnosis not present

## 2019-10-12 DIAGNOSIS — R3914 Feeling of incomplete bladder emptying: Secondary | ICD-10-CM | POA: Diagnosis not present

## 2019-10-12 DIAGNOSIS — N401 Enlarged prostate with lower urinary tract symptoms: Secondary | ICD-10-CM | POA: Diagnosis not present

## 2019-10-12 DIAGNOSIS — I129 Hypertensive chronic kidney disease with stage 1 through stage 4 chronic kidney disease, or unspecified chronic kidney disease: Secondary | ICD-10-CM | POA: Diagnosis not present

## 2019-10-14 DIAGNOSIS — N401 Enlarged prostate with lower urinary tract symptoms: Secondary | ICD-10-CM | POA: Diagnosis not present

## 2019-10-14 DIAGNOSIS — Z7982 Long term (current) use of aspirin: Secondary | ICD-10-CM | POA: Diagnosis not present

## 2019-10-14 DIAGNOSIS — Z905 Acquired absence of kidney: Secondary | ICD-10-CM | POA: Diagnosis not present

## 2019-10-14 DIAGNOSIS — R338 Other retention of urine: Secondary | ICD-10-CM | POA: Diagnosis not present

## 2019-10-14 DIAGNOSIS — Z9181 History of falling: Secondary | ICD-10-CM | POA: Diagnosis not present

## 2019-10-14 DIAGNOSIS — N184 Chronic kidney disease, stage 4 (severe): Secondary | ICD-10-CM | POA: Diagnosis not present

## 2019-10-14 DIAGNOSIS — R3914 Feeling of incomplete bladder emptying: Secondary | ICD-10-CM | POA: Diagnosis not present

## 2019-10-14 DIAGNOSIS — Z85528 Personal history of other malignant neoplasm of kidney: Secondary | ICD-10-CM | POA: Diagnosis not present

## 2019-10-14 DIAGNOSIS — F0390 Unspecified dementia without behavioral disturbance: Secondary | ICD-10-CM | POA: Diagnosis not present

## 2019-10-14 DIAGNOSIS — I482 Chronic atrial fibrillation, unspecified: Secondary | ICD-10-CM | POA: Diagnosis not present

## 2019-10-14 DIAGNOSIS — I129 Hypertensive chronic kidney disease with stage 1 through stage 4 chronic kidney disease, or unspecified chronic kidney disease: Secondary | ICD-10-CM | POA: Diagnosis not present

## 2019-10-16 DIAGNOSIS — I4891 Unspecified atrial fibrillation: Secondary | ICD-10-CM | POA: Diagnosis not present

## 2019-10-16 DIAGNOSIS — Z8553 Personal history of malignant neoplasm of renal pelvis: Secondary | ICD-10-CM | POA: Diagnosis not present

## 2019-10-16 DIAGNOSIS — F0150 Vascular dementia without behavioral disturbance: Secondary | ICD-10-CM | POA: Diagnosis not present

## 2019-10-16 DIAGNOSIS — I420 Dilated cardiomyopathy: Secondary | ICD-10-CM | POA: Insufficient documentation

## 2019-10-16 DIAGNOSIS — Z905 Acquired absence of kidney: Secondary | ICD-10-CM | POA: Diagnosis not present

## 2019-10-16 DIAGNOSIS — R001 Bradycardia, unspecified: Secondary | ICD-10-CM | POA: Diagnosis not present

## 2019-10-16 DIAGNOSIS — I440 Atrioventricular block, first degree: Secondary | ICD-10-CM | POA: Diagnosis not present

## 2019-10-16 DIAGNOSIS — I129 Hypertensive chronic kidney disease with stage 1 through stage 4 chronic kidney disease, or unspecified chronic kidney disease: Secondary | ICD-10-CM | POA: Diagnosis not present

## 2019-10-16 DIAGNOSIS — I480 Paroxysmal atrial fibrillation: Secondary | ICD-10-CM | POA: Insufficient documentation

## 2019-10-16 DIAGNOSIS — Z7982 Long term (current) use of aspirin: Secondary | ICD-10-CM | POA: Diagnosis not present

## 2019-10-16 DIAGNOSIS — Z79899 Other long term (current) drug therapy: Secondary | ICD-10-CM | POA: Diagnosis not present

## 2019-10-16 DIAGNOSIS — N184 Chronic kidney disease, stage 4 (severe): Secondary | ICD-10-CM | POA: Diagnosis not present

## 2019-10-16 DIAGNOSIS — I10 Essential (primary) hypertension: Secondary | ICD-10-CM | POA: Insufficient documentation

## 2019-10-16 DIAGNOSIS — F0390 Unspecified dementia without behavioral disturbance: Secondary | ICD-10-CM | POA: Diagnosis not present

## 2019-10-16 DIAGNOSIS — R0600 Dyspnea, unspecified: Secondary | ICD-10-CM | POA: Diagnosis not present

## 2019-10-16 DIAGNOSIS — I1310 Hypertensive heart and chronic kidney disease without heart failure, with stage 1 through stage 4 chronic kidney disease, or unspecified chronic kidney disease: Secondary | ICD-10-CM | POA: Diagnosis not present

## 2019-10-16 DIAGNOSIS — I447 Left bundle-branch block, unspecified: Secondary | ICD-10-CM | POA: Diagnosis not present

## 2019-10-16 DIAGNOSIS — R9431 Abnormal electrocardiogram [ECG] [EKG]: Secondary | ICD-10-CM | POA: Diagnosis not present

## 2019-10-17 DIAGNOSIS — I447 Left bundle-branch block, unspecified: Secondary | ICD-10-CM | POA: Diagnosis not present

## 2019-10-17 DIAGNOSIS — I440 Atrioventricular block, first degree: Secondary | ICD-10-CM | POA: Diagnosis not present

## 2019-10-24 DIAGNOSIS — D6489 Other specified anemias: Secondary | ICD-10-CM | POA: Diagnosis not present

## 2019-10-24 DIAGNOSIS — K2970 Gastritis, unspecified, without bleeding: Secondary | ICD-10-CM | POA: Diagnosis not present

## 2019-10-24 DIAGNOSIS — N184 Chronic kidney disease, stage 4 (severe): Secondary | ICD-10-CM | POA: Diagnosis not present

## 2019-10-24 DIAGNOSIS — R1310 Dysphagia, unspecified: Secondary | ICD-10-CM | POA: Diagnosis not present

## 2019-10-24 DIAGNOSIS — Z681 Body mass index (BMI) 19 or less, adult: Secondary | ICD-10-CM | POA: Diagnosis not present

## 2019-10-24 DIAGNOSIS — R64 Cachexia: Secondary | ICD-10-CM | POA: Diagnosis not present

## 2019-10-24 DIAGNOSIS — E441 Mild protein-calorie malnutrition: Secondary | ICD-10-CM | POA: Diagnosis not present

## 2019-10-26 DIAGNOSIS — E43 Unspecified severe protein-calorie malnutrition: Secondary | ICD-10-CM | POA: Diagnosis not present

## 2019-10-26 DIAGNOSIS — I129 Hypertensive chronic kidney disease with stage 1 through stage 4 chronic kidney disease, or unspecified chronic kidney disease: Secondary | ICD-10-CM | POA: Diagnosis not present

## 2019-10-26 DIAGNOSIS — Z905 Acquired absence of kidney: Secondary | ICD-10-CM | POA: Diagnosis not present

## 2019-10-26 DIAGNOSIS — K449 Diaphragmatic hernia without obstruction or gangrene: Secondary | ICD-10-CM | POA: Diagnosis not present

## 2019-10-26 DIAGNOSIS — E785 Hyperlipidemia, unspecified: Secondary | ICD-10-CM | POA: Diagnosis not present

## 2019-10-26 DIAGNOSIS — Z79899 Other long term (current) drug therapy: Secondary | ICD-10-CM | POA: Diagnosis not present

## 2019-10-26 DIAGNOSIS — I480 Paroxysmal atrial fibrillation: Secondary | ICD-10-CM | POA: Diagnosis not present

## 2019-10-26 DIAGNOSIS — R778 Other specified abnormalities of plasma proteins: Secondary | ICD-10-CM | POA: Diagnosis not present

## 2019-10-26 DIAGNOSIS — N179 Acute kidney failure, unspecified: Secondary | ICD-10-CM | POA: Diagnosis not present

## 2019-10-26 DIAGNOSIS — R197 Diarrhea, unspecified: Secondary | ICD-10-CM | POA: Diagnosis not present

## 2019-10-26 DIAGNOSIS — M1990 Unspecified osteoarthritis, unspecified site: Secondary | ICD-10-CM | POA: Diagnosis not present

## 2019-10-26 DIAGNOSIS — Z7982 Long term (current) use of aspirin: Secondary | ICD-10-CM | POA: Diagnosis not present

## 2019-10-26 DIAGNOSIS — N200 Calculus of kidney: Secondary | ICD-10-CM | POA: Diagnosis not present

## 2019-10-26 DIAGNOSIS — Z85528 Personal history of other malignant neoplasm of kidney: Secondary | ICD-10-CM | POA: Diagnosis not present

## 2019-10-26 DIAGNOSIS — E860 Dehydration: Secondary | ICD-10-CM | POA: Diagnosis not present

## 2019-10-26 DIAGNOSIS — R1310 Dysphagia, unspecified: Secondary | ICD-10-CM | POA: Diagnosis not present

## 2019-10-26 DIAGNOSIS — N400 Enlarged prostate without lower urinary tract symptoms: Secondary | ICD-10-CM | POA: Diagnosis not present

## 2019-10-26 DIAGNOSIS — E875 Hyperkalemia: Secondary | ICD-10-CM | POA: Diagnosis not present

## 2019-10-26 DIAGNOSIS — T462X5A Adverse effect of other antidysrhythmic drugs, initial encounter: Secondary | ICD-10-CM | POA: Diagnosis not present

## 2019-10-26 DIAGNOSIS — F0390 Unspecified dementia without behavioral disturbance: Secondary | ICD-10-CM | POA: Diagnosis not present

## 2019-10-26 DIAGNOSIS — N178 Other acute kidney failure: Secondary | ICD-10-CM | POA: Diagnosis not present

## 2019-10-26 DIAGNOSIS — N183 Chronic kidney disease, stage 3 unspecified: Secondary | ICD-10-CM | POA: Diagnosis not present

## 2019-10-26 DIAGNOSIS — G4700 Insomnia, unspecified: Secondary | ICD-10-CM | POA: Diagnosis not present

## 2019-10-26 DIAGNOSIS — N281 Cyst of kidney, acquired: Secondary | ICD-10-CM | POA: Diagnosis not present

## 2019-10-26 DIAGNOSIS — K219 Gastro-esophageal reflux disease without esophagitis: Secondary | ICD-10-CM | POA: Diagnosis not present

## 2019-10-26 DIAGNOSIS — I4891 Unspecified atrial fibrillation: Secondary | ICD-10-CM | POA: Diagnosis not present

## 2019-10-26 DIAGNOSIS — Z87891 Personal history of nicotine dependence: Secondary | ICD-10-CM | POA: Diagnosis not present

## 2019-10-26 DIAGNOSIS — R0600 Dyspnea, unspecified: Secondary | ICD-10-CM | POA: Diagnosis not present

## 2019-10-26 DIAGNOSIS — Z681 Body mass index (BMI) 19 or less, adult: Secondary | ICD-10-CM | POA: Diagnosis not present

## 2019-10-26 DIAGNOSIS — E872 Acidosis: Secondary | ICD-10-CM | POA: Diagnosis not present

## 2019-11-07 DIAGNOSIS — M2550 Pain in unspecified joint: Secondary | ICD-10-CM | POA: Diagnosis not present

## 2019-11-07 DIAGNOSIS — Z681 Body mass index (BMI) 19 or less, adult: Secondary | ICD-10-CM | POA: Diagnosis not present

## 2019-11-07 DIAGNOSIS — R531 Weakness: Secondary | ICD-10-CM | POA: Diagnosis not present

## 2019-11-07 DIAGNOSIS — R197 Diarrhea, unspecified: Secondary | ICD-10-CM | POA: Diagnosis not present

## 2019-11-07 DIAGNOSIS — N170 Acute kidney failure with tubular necrosis: Secondary | ICD-10-CM | POA: Diagnosis not present

## 2019-11-07 DIAGNOSIS — Z79899 Other long term (current) drug therapy: Secondary | ICD-10-CM | POA: Diagnosis not present

## 2019-11-07 DIAGNOSIS — K5730 Diverticulosis of large intestine without perforation or abscess without bleeding: Secondary | ICD-10-CM | POA: Diagnosis not present

## 2019-11-07 DIAGNOSIS — E876 Hypokalemia: Secondary | ICD-10-CM | POA: Diagnosis not present

## 2019-11-07 DIAGNOSIS — Z85528 Personal history of other malignant neoplasm of kidney: Secondary | ICD-10-CM | POA: Diagnosis not present

## 2019-11-07 DIAGNOSIS — Z87891 Personal history of nicotine dependence: Secondary | ICD-10-CM | POA: Diagnosis not present

## 2019-11-07 DIAGNOSIS — N189 Chronic kidney disease, unspecified: Secondary | ICD-10-CM | POA: Diagnosis not present

## 2019-11-07 DIAGNOSIS — R29898 Other symptoms and signs involving the musculoskeletal system: Secondary | ICD-10-CM | POA: Diagnosis not present

## 2019-11-07 DIAGNOSIS — N179 Acute kidney failure, unspecified: Secondary | ICD-10-CM | POA: Diagnosis not present

## 2019-11-07 DIAGNOSIS — N200 Calculus of kidney: Secondary | ICD-10-CM | POA: Diagnosis not present

## 2019-11-07 DIAGNOSIS — Z7401 Bed confinement status: Secondary | ICD-10-CM | POA: Diagnosis not present

## 2019-11-07 DIAGNOSIS — Z7982 Long term (current) use of aspirin: Secondary | ICD-10-CM | POA: Diagnosis not present

## 2019-11-07 DIAGNOSIS — I4891 Unspecified atrial fibrillation: Secondary | ICD-10-CM | POA: Diagnosis not present

## 2019-11-07 DIAGNOSIS — Z87442 Personal history of urinary calculi: Secondary | ICD-10-CM | POA: Diagnosis not present

## 2019-11-07 DIAGNOSIS — N400 Enlarged prostate without lower urinary tract symptoms: Secondary | ICD-10-CM | POA: Diagnosis not present

## 2019-11-07 DIAGNOSIS — E43 Unspecified severe protein-calorie malnutrition: Secondary | ICD-10-CM | POA: Diagnosis not present

## 2019-11-07 DIAGNOSIS — R1111 Vomiting without nausea: Secondary | ICD-10-CM | POA: Diagnosis not present

## 2019-11-07 DIAGNOSIS — I129 Hypertensive chronic kidney disease with stage 1 through stage 4 chronic kidney disease, or unspecified chronic kidney disease: Secondary | ICD-10-CM | POA: Diagnosis not present

## 2019-11-07 DIAGNOSIS — F0390 Unspecified dementia without behavioral disturbance: Secondary | ICD-10-CM | POA: Diagnosis not present

## 2019-11-07 DIAGNOSIS — E860 Dehydration: Secondary | ICD-10-CM | POA: Diagnosis not present

## 2019-11-07 DIAGNOSIS — Z905 Acquired absence of kidney: Secondary | ICD-10-CM | POA: Diagnosis not present

## 2019-11-07 DIAGNOSIS — D509 Iron deficiency anemia, unspecified: Secondary | ICD-10-CM | POA: Diagnosis not present

## 2019-11-07 DIAGNOSIS — A0472 Enterocolitis due to Clostridium difficile, not specified as recurrent: Secondary | ICD-10-CM | POA: Diagnosis not present

## 2019-11-07 DIAGNOSIS — K219 Gastro-esophageal reflux disease without esophagitis: Secondary | ICD-10-CM | POA: Diagnosis not present

## 2019-11-13 DIAGNOSIS — N401 Enlarged prostate with lower urinary tract symptoms: Secondary | ICD-10-CM | POA: Diagnosis not present

## 2019-11-13 DIAGNOSIS — Z7982 Long term (current) use of aspirin: Secondary | ICD-10-CM | POA: Diagnosis not present

## 2019-11-13 DIAGNOSIS — N184 Chronic kidney disease, stage 4 (severe): Secondary | ICD-10-CM | POA: Diagnosis not present

## 2019-11-13 DIAGNOSIS — I482 Chronic atrial fibrillation, unspecified: Secondary | ICD-10-CM | POA: Diagnosis not present

## 2019-11-13 DIAGNOSIS — I129 Hypertensive chronic kidney disease with stage 1 through stage 4 chronic kidney disease, or unspecified chronic kidney disease: Secondary | ICD-10-CM | POA: Diagnosis not present

## 2019-11-13 DIAGNOSIS — R338 Other retention of urine: Secondary | ICD-10-CM | POA: Diagnosis not present

## 2019-11-13 DIAGNOSIS — Z85528 Personal history of other malignant neoplasm of kidney: Secondary | ICD-10-CM | POA: Diagnosis not present

## 2019-11-13 DIAGNOSIS — Z9181 History of falling: Secondary | ICD-10-CM | POA: Diagnosis not present

## 2019-11-13 DIAGNOSIS — R3914 Feeling of incomplete bladder emptying: Secondary | ICD-10-CM | POA: Diagnosis not present

## 2019-11-13 DIAGNOSIS — Z905 Acquired absence of kidney: Secondary | ICD-10-CM | POA: Diagnosis not present

## 2019-11-13 DIAGNOSIS — F0390 Unspecified dementia without behavioral disturbance: Secondary | ICD-10-CM | POA: Diagnosis not present

## 2019-11-16 DIAGNOSIS — Z7189 Other specified counseling: Secondary | ICD-10-CM | POA: Diagnosis not present

## 2019-11-16 DIAGNOSIS — F0390 Unspecified dementia without behavioral disturbance: Secondary | ICD-10-CM | POA: Diagnosis not present

## 2019-11-16 DIAGNOSIS — D6489 Other specified anemias: Secondary | ICD-10-CM | POA: Diagnosis not present

## 2019-11-16 DIAGNOSIS — N184 Chronic kidney disease, stage 4 (severe): Secondary | ICD-10-CM | POA: Diagnosis not present

## 2019-11-16 DIAGNOSIS — E43 Unspecified severe protein-calorie malnutrition: Secondary | ICD-10-CM | POA: Diagnosis not present

## 2019-11-16 DIAGNOSIS — A0472 Enterocolitis due to Clostridium difficile, not specified as recurrent: Secondary | ICD-10-CM | POA: Diagnosis not present

## 2019-11-16 DIAGNOSIS — R64 Cachexia: Secondary | ICD-10-CM | POA: Diagnosis not present

## 2019-11-19 DIAGNOSIS — I129 Hypertensive chronic kidney disease with stage 1 through stage 4 chronic kidney disease, or unspecified chronic kidney disease: Secondary | ICD-10-CM | POA: Diagnosis not present

## 2019-11-19 DIAGNOSIS — N184 Chronic kidney disease, stage 4 (severe): Secondary | ICD-10-CM | POA: Diagnosis not present

## 2019-11-19 DIAGNOSIS — Z905 Acquired absence of kidney: Secondary | ICD-10-CM | POA: Insufficient documentation

## 2019-11-19 DIAGNOSIS — I420 Dilated cardiomyopathy: Secondary | ICD-10-CM | POA: Diagnosis not present

## 2019-11-21 DIAGNOSIS — Z7982 Long term (current) use of aspirin: Secondary | ICD-10-CM | POA: Diagnosis not present

## 2019-11-21 DIAGNOSIS — I482 Chronic atrial fibrillation, unspecified: Secondary | ICD-10-CM | POA: Diagnosis not present

## 2019-11-21 DIAGNOSIS — N401 Enlarged prostate with lower urinary tract symptoms: Secondary | ICD-10-CM | POA: Diagnosis not present

## 2019-11-21 DIAGNOSIS — I129 Hypertensive chronic kidney disease with stage 1 through stage 4 chronic kidney disease, or unspecified chronic kidney disease: Secondary | ICD-10-CM | POA: Diagnosis not present

## 2019-11-21 DIAGNOSIS — F0390 Unspecified dementia without behavioral disturbance: Secondary | ICD-10-CM | POA: Diagnosis not present

## 2019-11-21 DIAGNOSIS — Z905 Acquired absence of kidney: Secondary | ICD-10-CM | POA: Diagnosis not present

## 2019-11-21 DIAGNOSIS — N184 Chronic kidney disease, stage 4 (severe): Secondary | ICD-10-CM | POA: Diagnosis not present

## 2019-11-21 DIAGNOSIS — Z9181 History of falling: Secondary | ICD-10-CM | POA: Diagnosis not present

## 2019-11-21 DIAGNOSIS — R338 Other retention of urine: Secondary | ICD-10-CM | POA: Diagnosis not present

## 2019-11-21 DIAGNOSIS — Z85528 Personal history of other malignant neoplasm of kidney: Secondary | ICD-10-CM | POA: Diagnosis not present

## 2019-11-21 DIAGNOSIS — R3914 Feeling of incomplete bladder emptying: Secondary | ICD-10-CM | POA: Diagnosis not present

## 2019-12-04 DIAGNOSIS — E46 Unspecified protein-calorie malnutrition: Secondary | ICD-10-CM | POA: Diagnosis not present

## 2019-12-04 DIAGNOSIS — E43 Unspecified severe protein-calorie malnutrition: Secondary | ICD-10-CM | POA: Diagnosis not present

## 2019-12-04 DIAGNOSIS — A0472 Enterocolitis due to Clostridium difficile, not specified as recurrent: Secondary | ICD-10-CM | POA: Diagnosis not present

## 2019-12-04 DIAGNOSIS — Z9181 History of falling: Secondary | ICD-10-CM | POA: Diagnosis not present

## 2019-12-04 DIAGNOSIS — N184 Chronic kidney disease, stage 4 (severe): Secondary | ICD-10-CM | POA: Diagnosis not present

## 2019-12-04 DIAGNOSIS — Z7982 Long term (current) use of aspirin: Secondary | ICD-10-CM | POA: Diagnosis not present

## 2019-12-04 DIAGNOSIS — Z905 Acquired absence of kidney: Secondary | ICD-10-CM | POA: Diagnosis not present

## 2019-12-04 DIAGNOSIS — Z85528 Personal history of other malignant neoplasm of kidney: Secondary | ICD-10-CM | POA: Diagnosis not present

## 2019-12-04 DIAGNOSIS — N401 Enlarged prostate with lower urinary tract symptoms: Secondary | ICD-10-CM | POA: Diagnosis not present

## 2019-12-04 DIAGNOSIS — I482 Chronic atrial fibrillation, unspecified: Secondary | ICD-10-CM | POA: Diagnosis not present

## 2019-12-04 DIAGNOSIS — F0390 Unspecified dementia without behavioral disturbance: Secondary | ICD-10-CM | POA: Diagnosis not present

## 2019-12-04 DIAGNOSIS — E860 Dehydration: Secondary | ICD-10-CM | POA: Diagnosis not present

## 2019-12-04 DIAGNOSIS — R410 Disorientation, unspecified: Secondary | ICD-10-CM | POA: Diagnosis not present

## 2019-12-04 DIAGNOSIS — D631 Anemia in chronic kidney disease: Secondary | ICD-10-CM | POA: Diagnosis not present

## 2019-12-04 DIAGNOSIS — I129 Hypertensive chronic kidney disease with stage 1 through stage 4 chronic kidney disease, or unspecified chronic kidney disease: Secondary | ICD-10-CM | POA: Diagnosis not present

## 2019-12-04 DIAGNOSIS — R338 Other retention of urine: Secondary | ICD-10-CM | POA: Diagnosis not present

## 2019-12-04 DIAGNOSIS — R636 Underweight: Secondary | ICD-10-CM | POA: Diagnosis not present

## 2019-12-04 DIAGNOSIS — R0600 Dyspnea, unspecified: Secondary | ICD-10-CM | POA: Diagnosis not present

## 2019-12-05 DIAGNOSIS — I129 Hypertensive chronic kidney disease with stage 1 through stage 4 chronic kidney disease, or unspecified chronic kidney disease: Secondary | ICD-10-CM | POA: Diagnosis not present

## 2019-12-05 DIAGNOSIS — I10 Essential (primary) hypertension: Secondary | ICD-10-CM | POA: Diagnosis not present

## 2019-12-05 DIAGNOSIS — Z87891 Personal history of nicotine dependence: Secondary | ICD-10-CM | POA: Diagnosis not present

## 2019-12-05 DIAGNOSIS — E860 Dehydration: Secondary | ICD-10-CM | POA: Diagnosis not present

## 2019-12-05 DIAGNOSIS — J449 Chronic obstructive pulmonary disease, unspecified: Secondary | ICD-10-CM | POA: Diagnosis not present

## 2019-12-05 DIAGNOSIS — R634 Abnormal weight loss: Secondary | ICD-10-CM | POA: Diagnosis not present

## 2019-12-05 DIAGNOSIS — N189 Chronic kidney disease, unspecified: Secondary | ICD-10-CM | POA: Diagnosis not present

## 2019-12-05 DIAGNOSIS — J90 Pleural effusion, not elsewhere classified: Secondary | ICD-10-CM | POA: Diagnosis not present

## 2019-12-10 DIAGNOSIS — A0472 Enterocolitis due to Clostridium difficile, not specified as recurrent: Secondary | ICD-10-CM | POA: Diagnosis not present

## 2019-12-12 DIAGNOSIS — K753 Granulomatous hepatitis, not elsewhere classified: Secondary | ICD-10-CM | POA: Diagnosis not present

## 2019-12-12 DIAGNOSIS — R39198 Other difficulties with micturition: Secondary | ICD-10-CM | POA: Diagnosis not present

## 2019-12-12 DIAGNOSIS — R339 Retention of urine, unspecified: Secondary | ICD-10-CM | POA: Diagnosis not present

## 2019-12-12 DIAGNOSIS — N189 Chronic kidney disease, unspecified: Secondary | ICD-10-CM | POA: Diagnosis not present

## 2019-12-12 DIAGNOSIS — R109 Unspecified abdominal pain: Secondary | ICD-10-CM | POA: Diagnosis not present

## 2019-12-12 DIAGNOSIS — R778 Other specified abnormalities of plasma proteins: Secondary | ICD-10-CM | POA: Diagnosis not present

## 2019-12-12 DIAGNOSIS — N184 Chronic kidney disease, stage 4 (severe): Secondary | ICD-10-CM | POA: Diagnosis not present

## 2019-12-12 DIAGNOSIS — I129 Hypertensive chronic kidney disease with stage 1 through stage 4 chronic kidney disease, or unspecified chronic kidney disease: Secondary | ICD-10-CM | POA: Diagnosis not present

## 2019-12-12 DIAGNOSIS — R7989 Other specified abnormal findings of blood chemistry: Secondary | ICD-10-CM | POA: Diagnosis not present

## 2019-12-12 DIAGNOSIS — N200 Calculus of kidney: Secondary | ICD-10-CM | POA: Diagnosis not present

## 2019-12-12 DIAGNOSIS — K7689 Other specified diseases of liver: Secondary | ICD-10-CM | POA: Diagnosis not present

## 2019-12-12 DIAGNOSIS — J449 Chronic obstructive pulmonary disease, unspecified: Secondary | ICD-10-CM | POA: Diagnosis not present

## 2019-12-21 DIAGNOSIS — Z978 Presence of other specified devices: Secondary | ICD-10-CM | POA: Diagnosis not present

## 2019-12-21 DIAGNOSIS — N401 Enlarged prostate with lower urinary tract symptoms: Secondary | ICD-10-CM | POA: Diagnosis not present

## 2019-12-21 DIAGNOSIS — R339 Retention of urine, unspecified: Secondary | ICD-10-CM | POA: Diagnosis not present

## 2019-12-26 DIAGNOSIS — R339 Retention of urine, unspecified: Secondary | ICD-10-CM | POA: Diagnosis not present

## 2019-12-26 DIAGNOSIS — Z978 Presence of other specified devices: Secondary | ICD-10-CM | POA: Diagnosis not present

## 2019-12-26 DIAGNOSIS — N401 Enlarged prostate with lower urinary tract symptoms: Secondary | ICD-10-CM | POA: Diagnosis not present

## 2020-01-09 DIAGNOSIS — N401 Enlarged prostate with lower urinary tract symptoms: Secondary | ICD-10-CM | POA: Diagnosis not present

## 2020-01-09 DIAGNOSIS — R339 Retention of urine, unspecified: Secondary | ICD-10-CM | POA: Diagnosis not present

## 2020-01-17 DIAGNOSIS — F0390 Unspecified dementia without behavioral disturbance: Secondary | ICD-10-CM | POA: Diagnosis not present

## 2020-01-17 DIAGNOSIS — E43 Unspecified severe protein-calorie malnutrition: Secondary | ICD-10-CM | POA: Diagnosis not present

## 2020-01-17 DIAGNOSIS — N184 Chronic kidney disease, stage 4 (severe): Secondary | ICD-10-CM | POA: Diagnosis not present

## 2020-01-17 DIAGNOSIS — K2960 Other gastritis without bleeding: Secondary | ICD-10-CM | POA: Diagnosis not present

## 2020-01-17 DIAGNOSIS — G720 Drug-induced myopathy: Secondary | ICD-10-CM | POA: Diagnosis not present

## 2020-01-17 DIAGNOSIS — R636 Underweight: Secondary | ICD-10-CM | POA: Diagnosis not present

## 2020-01-17 DIAGNOSIS — D6489 Other specified anemias: Secondary | ICD-10-CM | POA: Diagnosis not present

## 2020-01-17 DIAGNOSIS — G2581 Restless legs syndrome: Secondary | ICD-10-CM | POA: Diagnosis not present

## 2020-01-17 DIAGNOSIS — Z681 Body mass index (BMI) 19 or less, adult: Secondary | ICD-10-CM | POA: Diagnosis not present

## 2020-01-24 DIAGNOSIS — E538 Deficiency of other specified B group vitamins: Secondary | ICD-10-CM | POA: Diagnosis not present

## 2020-01-31 DIAGNOSIS — E538 Deficiency of other specified B group vitamins: Secondary | ICD-10-CM | POA: Diagnosis not present

## 2020-01-31 DIAGNOSIS — Z23 Encounter for immunization: Secondary | ICD-10-CM | POA: Diagnosis not present

## 2020-02-06 DIAGNOSIS — N390 Urinary tract infection, site not specified: Secondary | ICD-10-CM | POA: Diagnosis not present

## 2020-02-06 DIAGNOSIS — R339 Retention of urine, unspecified: Secondary | ICD-10-CM | POA: Diagnosis not present

## 2020-02-06 DIAGNOSIS — N401 Enlarged prostate with lower urinary tract symptoms: Secondary | ICD-10-CM | POA: Diagnosis not present

## 2020-02-07 DIAGNOSIS — E538 Deficiency of other specified B group vitamins: Secondary | ICD-10-CM | POA: Diagnosis not present

## 2020-02-15 DIAGNOSIS — E538 Deficiency of other specified B group vitamins: Secondary | ICD-10-CM | POA: Diagnosis not present

## 2020-03-10 DIAGNOSIS — N401 Enlarged prostate with lower urinary tract symptoms: Secondary | ICD-10-CM | POA: Diagnosis not present

## 2020-03-10 DIAGNOSIS — N390 Urinary tract infection, site not specified: Secondary | ICD-10-CM | POA: Diagnosis not present

## 2020-03-10 DIAGNOSIS — R339 Retention of urine, unspecified: Secondary | ICD-10-CM | POA: Diagnosis not present

## 2020-06-04 DIAGNOSIS — Z8701 Personal history of pneumonia (recurrent): Secondary | ICD-10-CM | POA: Diagnosis not present

## 2020-06-09 DIAGNOSIS — N390 Urinary tract infection, site not specified: Secondary | ICD-10-CM | POA: Diagnosis not present

## 2020-06-11 DIAGNOSIS — N390 Urinary tract infection, site not specified: Secondary | ICD-10-CM | POA: Diagnosis not present

## 2020-06-11 DIAGNOSIS — R339 Retention of urine, unspecified: Secondary | ICD-10-CM | POA: Diagnosis not present

## 2020-06-11 DIAGNOSIS — N401 Enlarged prostate with lower urinary tract symptoms: Secondary | ICD-10-CM | POA: Diagnosis not present

## 2020-07-26 DIAGNOSIS — R197 Diarrhea, unspecified: Secondary | ICD-10-CM | POA: Diagnosis not present

## 2020-07-26 DIAGNOSIS — Z20828 Contact with and (suspected) exposure to other viral communicable diseases: Secondary | ICD-10-CM | POA: Diagnosis not present

## 2020-07-26 DIAGNOSIS — R112 Nausea with vomiting, unspecified: Secondary | ICD-10-CM | POA: Diagnosis not present

## 2020-07-26 DIAGNOSIS — A78 Q fever: Secondary | ICD-10-CM | POA: Diagnosis not present

## 2020-07-26 DIAGNOSIS — A0472 Enterocolitis due to Clostridium difficile, not specified as recurrent: Secondary | ICD-10-CM | POA: Diagnosis not present

## 2020-07-26 DIAGNOSIS — R509 Fever, unspecified: Secondary | ICD-10-CM | POA: Diagnosis not present

## 2020-07-26 DIAGNOSIS — K591 Functional diarrhea: Secondary | ICD-10-CM | POA: Diagnosis not present

## 2020-07-26 DIAGNOSIS — A047 Enterocolitis due to Clostridium difficile: Secondary | ICD-10-CM | POA: Diagnosis not present

## 2020-07-27 DIAGNOSIS — R197 Diarrhea, unspecified: Secondary | ICD-10-CM | POA: Diagnosis not present

## 2020-08-04 DIAGNOSIS — R64 Cachexia: Secondary | ICD-10-CM | POA: Diagnosis not present

## 2020-08-04 DIAGNOSIS — E43 Unspecified severe protein-calorie malnutrition: Secondary | ICD-10-CM | POA: Diagnosis not present

## 2020-08-04 DIAGNOSIS — N184 Chronic kidney disease, stage 4 (severe): Secondary | ICD-10-CM | POA: Diagnosis not present

## 2020-08-04 DIAGNOSIS — Z79899 Other long term (current) drug therapy: Secondary | ICD-10-CM | POA: Diagnosis not present

## 2020-08-04 DIAGNOSIS — Z1331 Encounter for screening for depression: Secondary | ICD-10-CM | POA: Diagnosis not present

## 2020-08-04 DIAGNOSIS — Z9181 History of falling: Secondary | ICD-10-CM | POA: Diagnosis not present

## 2020-08-04 DIAGNOSIS — F0390 Unspecified dementia without behavioral disturbance: Secondary | ICD-10-CM | POA: Diagnosis not present

## 2020-08-04 DIAGNOSIS — E860 Dehydration: Secondary | ICD-10-CM | POA: Diagnosis not present

## 2020-08-04 DIAGNOSIS — N170 Acute kidney failure with tubular necrosis: Secondary | ICD-10-CM | POA: Diagnosis not present

## 2020-08-27 DIAGNOSIS — I5020 Unspecified systolic (congestive) heart failure: Secondary | ICD-10-CM | POA: Diagnosis not present

## 2020-08-27 DIAGNOSIS — N184 Chronic kidney disease, stage 4 (severe): Secondary | ICD-10-CM | POA: Diagnosis not present

## 2020-08-27 DIAGNOSIS — I420 Dilated cardiomyopathy: Secondary | ICD-10-CM | POA: Diagnosis not present

## 2020-08-27 DIAGNOSIS — I700 Atherosclerosis of aorta: Secondary | ICD-10-CM | POA: Diagnosis not present

## 2020-08-27 DIAGNOSIS — E43 Unspecified severe protein-calorie malnutrition: Secondary | ICD-10-CM | POA: Diagnosis not present

## 2020-08-27 DIAGNOSIS — I4819 Other persistent atrial fibrillation: Secondary | ICD-10-CM | POA: Diagnosis not present

## 2020-08-27 DIAGNOSIS — R64 Cachexia: Secondary | ICD-10-CM | POA: Diagnosis not present

## 2020-08-27 DIAGNOSIS — D6869 Other thrombophilia: Secondary | ICD-10-CM | POA: Diagnosis not present

## 2020-08-27 DIAGNOSIS — E860 Dehydration: Secondary | ICD-10-CM | POA: Diagnosis not present

## 2020-08-27 DIAGNOSIS — A0472 Enterocolitis due to Clostridium difficile, not specified as recurrent: Secondary | ICD-10-CM | POA: Diagnosis not present

## 2020-08-27 DIAGNOSIS — F0390 Unspecified dementia without behavioral disturbance: Secondary | ICD-10-CM | POA: Diagnosis not present

## 2020-08-27 DIAGNOSIS — D692 Other nonthrombocytopenic purpura: Secondary | ICD-10-CM | POA: Diagnosis not present

## 2020-08-29 DIAGNOSIS — I129 Hypertensive chronic kidney disease with stage 1 through stage 4 chronic kidney disease, or unspecified chronic kidney disease: Secondary | ICD-10-CM | POA: Diagnosis not present

## 2020-08-29 DIAGNOSIS — E43 Unspecified severe protein-calorie malnutrition: Secondary | ICD-10-CM | POA: Diagnosis not present

## 2020-08-29 DIAGNOSIS — I10 Essential (primary) hypertension: Secondary | ICD-10-CM | POA: Diagnosis not present

## 2020-08-29 DIAGNOSIS — R001 Bradycardia, unspecified: Secondary | ICD-10-CM | POA: Diagnosis not present

## 2020-08-29 DIAGNOSIS — M109 Gout, unspecified: Secondary | ICD-10-CM | POA: Diagnosis not present

## 2020-08-29 DIAGNOSIS — Z79899 Other long term (current) drug therapy: Secondary | ICD-10-CM | POA: Diagnosis not present

## 2020-08-29 DIAGNOSIS — A0471 Enterocolitis due to Clostridium difficile, recurrent: Secondary | ICD-10-CM | POA: Diagnosis not present

## 2020-08-29 DIAGNOSIS — Z7952 Long term (current) use of systemic steroids: Secondary | ICD-10-CM | POA: Diagnosis not present

## 2020-08-29 DIAGNOSIS — R000 Tachycardia, unspecified: Secondary | ICD-10-CM | POA: Diagnosis not present

## 2020-08-29 DIAGNOSIS — J449 Chronic obstructive pulmonary disease, unspecified: Secondary | ICD-10-CM | POA: Diagnosis not present

## 2020-08-29 DIAGNOSIS — R351 Nocturia: Secondary | ICD-10-CM | POA: Diagnosis not present

## 2020-08-29 DIAGNOSIS — N183 Chronic kidney disease, stage 3 unspecified: Secondary | ICD-10-CM | POA: Diagnosis not present

## 2020-08-29 DIAGNOSIS — Z681 Body mass index (BMI) 19 or less, adult: Secondary | ICD-10-CM | POA: Diagnosis not present

## 2020-08-29 DIAGNOSIS — M79671 Pain in right foot: Secondary | ICD-10-CM | POA: Diagnosis not present

## 2020-08-29 DIAGNOSIS — F0390 Unspecified dementia without behavioral disturbance: Secondary | ICD-10-CM | POA: Diagnosis not present

## 2020-08-29 DIAGNOSIS — Z20822 Contact with and (suspected) exposure to covid-19: Secondary | ICD-10-CM | POA: Diagnosis not present

## 2020-08-29 DIAGNOSIS — N3001 Acute cystitis with hematuria: Secondary | ICD-10-CM | POA: Diagnosis not present

## 2020-08-29 DIAGNOSIS — N184 Chronic kidney disease, stage 4 (severe): Secondary | ICD-10-CM | POA: Diagnosis not present

## 2020-08-29 DIAGNOSIS — I480 Paroxysmal atrial fibrillation: Secondary | ICD-10-CM | POA: Diagnosis not present

## 2020-08-29 DIAGNOSIS — Z87891 Personal history of nicotine dependence: Secondary | ICD-10-CM | POA: Diagnosis not present

## 2020-08-29 DIAGNOSIS — N401 Enlarged prostate with lower urinary tract symptoms: Secondary | ICD-10-CM | POA: Diagnosis not present

## 2020-08-29 DIAGNOSIS — Z905 Acquired absence of kidney: Secondary | ICD-10-CM | POA: Diagnosis not present

## 2020-08-29 DIAGNOSIS — R509 Fever, unspecified: Secondary | ICD-10-CM | POA: Diagnosis not present

## 2020-08-29 DIAGNOSIS — Z85528 Personal history of other malignant neoplasm of kidney: Secondary | ICD-10-CM | POA: Diagnosis not present

## 2020-08-29 DIAGNOSIS — R0689 Other abnormalities of breathing: Secondary | ICD-10-CM | POA: Diagnosis not present

## 2020-08-29 DIAGNOSIS — R069 Unspecified abnormalities of breathing: Secondary | ICD-10-CM | POA: Diagnosis not present

## 2020-08-29 DIAGNOSIS — A419 Sepsis, unspecified organism: Secondary | ICD-10-CM | POA: Diagnosis not present

## 2020-08-29 DIAGNOSIS — K219 Gastro-esophageal reflux disease without esophagitis: Secondary | ICD-10-CM | POA: Diagnosis not present

## 2020-08-29 DIAGNOSIS — A498 Other bacterial infections of unspecified site: Secondary | ICD-10-CM | POA: Diagnosis not present

## 2020-08-31 DIAGNOSIS — R001 Bradycardia, unspecified: Secondary | ICD-10-CM | POA: Diagnosis not present

## 2020-09-08 DIAGNOSIS — A0472 Enterocolitis due to Clostridium difficile, not specified as recurrent: Secondary | ICD-10-CM | POA: Diagnosis not present

## 2020-09-08 DIAGNOSIS — M6281 Muscle weakness (generalized): Secondary | ICD-10-CM | POA: Diagnosis not present

## 2020-09-08 DIAGNOSIS — G720 Drug-induced myopathy: Secondary | ICD-10-CM | POA: Diagnosis not present

## 2020-09-08 DIAGNOSIS — F0390 Unspecified dementia without behavioral disturbance: Secondary | ICD-10-CM | POA: Diagnosis not present

## 2020-09-08 DIAGNOSIS — N184 Chronic kidney disease, stage 4 (severe): Secondary | ICD-10-CM | POA: Diagnosis not present

## 2020-09-08 DIAGNOSIS — E43 Unspecified severe protein-calorie malnutrition: Secondary | ICD-10-CM | POA: Diagnosis not present

## 2020-09-08 DIAGNOSIS — Z0000 Encounter for general adult medical examination without abnormal findings: Secondary | ICD-10-CM | POA: Diagnosis not present

## 2020-09-08 DIAGNOSIS — N401 Enlarged prostate with lower urinary tract symptoms: Secondary | ICD-10-CM | POA: Diagnosis not present

## 2020-09-08 DIAGNOSIS — E785 Hyperlipidemia, unspecified: Secondary | ICD-10-CM | POA: Diagnosis not present

## 2020-09-08 DIAGNOSIS — G2581 Restless legs syndrome: Secondary | ICD-10-CM | POA: Diagnosis not present

## 2020-09-08 DIAGNOSIS — I10 Essential (primary) hypertension: Secondary | ICD-10-CM | POA: Diagnosis not present

## 2020-09-08 DIAGNOSIS — R2681 Unsteadiness on feet: Secondary | ICD-10-CM | POA: Diagnosis not present

## 2020-09-16 DIAGNOSIS — Z79899 Other long term (current) drug therapy: Secondary | ICD-10-CM | POA: Diagnosis not present

## 2020-09-16 DIAGNOSIS — N390 Urinary tract infection, site not specified: Secondary | ICD-10-CM | POA: Diagnosis not present

## 2020-09-16 DIAGNOSIS — N401 Enlarged prostate with lower urinary tract symptoms: Secondary | ICD-10-CM | POA: Diagnosis not present

## 2020-09-16 DIAGNOSIS — R339 Retention of urine, unspecified: Secondary | ICD-10-CM | POA: Diagnosis not present

## 2020-09-30 DIAGNOSIS — N401 Enlarged prostate with lower urinary tract symptoms: Secondary | ICD-10-CM | POA: Diagnosis not present

## 2020-09-30 DIAGNOSIS — N390 Urinary tract infection, site not specified: Secondary | ICD-10-CM | POA: Diagnosis not present

## 2020-09-30 DIAGNOSIS — R339 Retention of urine, unspecified: Secondary | ICD-10-CM | POA: Diagnosis not present

## 2020-10-01 DIAGNOSIS — R918 Other nonspecific abnormal finding of lung field: Secondary | ICD-10-CM | POA: Diagnosis not present

## 2020-10-03 DIAGNOSIS — N390 Urinary tract infection, site not specified: Secondary | ICD-10-CM | POA: Diagnosis not present

## 2020-10-03 DIAGNOSIS — Z79899 Other long term (current) drug therapy: Secondary | ICD-10-CM | POA: Diagnosis not present

## 2020-10-10 DIAGNOSIS — N390 Urinary tract infection, site not specified: Secondary | ICD-10-CM | POA: Diagnosis not present

## 2020-11-04 DIAGNOSIS — R339 Retention of urine, unspecified: Secondary | ICD-10-CM | POA: Diagnosis not present

## 2020-11-04 DIAGNOSIS — N401 Enlarged prostate with lower urinary tract symptoms: Secondary | ICD-10-CM | POA: Diagnosis not present

## 2020-11-04 DIAGNOSIS — N390 Urinary tract infection, site not specified: Secondary | ICD-10-CM | POA: Diagnosis not present

## 2020-12-02 DIAGNOSIS — R531 Weakness: Secondary | ICD-10-CM | POA: Diagnosis not present

## 2020-12-02 DIAGNOSIS — Z20828 Contact with and (suspected) exposure to other viral communicable diseases: Secondary | ICD-10-CM | POA: Diagnosis not present

## 2020-12-02 DIAGNOSIS — R8271 Bacteriuria: Secondary | ICD-10-CM | POA: Diagnosis not present

## 2020-12-02 DIAGNOSIS — N184 Chronic kidney disease, stage 4 (severe): Secondary | ICD-10-CM | POA: Diagnosis not present

## 2020-12-02 DIAGNOSIS — R197 Diarrhea, unspecified: Secondary | ICD-10-CM | POA: Diagnosis not present

## 2020-12-02 DIAGNOSIS — R8281 Pyuria: Secondary | ICD-10-CM | POA: Diagnosis not present

## 2022-04-23 ENCOUNTER — Ambulatory Visit: Payer: PPO | Admitting: Podiatry

## 2022-04-23 DIAGNOSIS — L84 Corns and callosities: Secondary | ICD-10-CM

## 2022-04-23 DIAGNOSIS — Q828 Other specified congenital malformations of skin: Secondary | ICD-10-CM | POA: Diagnosis not present

## 2022-04-23 DIAGNOSIS — B351 Tinea unguium: Secondary | ICD-10-CM | POA: Diagnosis not present

## 2022-04-23 DIAGNOSIS — M79675 Pain in left toe(s): Secondary | ICD-10-CM | POA: Diagnosis not present

## 2022-04-23 DIAGNOSIS — M79674 Pain in right toe(s): Secondary | ICD-10-CM

## 2022-04-23 NOTE — Progress Notes (Signed)
  Subjective:  Patient ID: Barry Weber, male    DOB: September 04, 1927,  MRN: [id number]  Chief Complaint  Patient presents with   Toe Pain    Right foot 3rd toe     89 y.o. male presents with the above complaint. History confirmed with patient. Patient presenting with pain related to dystrophic thickened elongated nails. Patient is unable to trim own nails related to nail dystrophy and/or mobility issues. Patient does not have a history of T2DM. Patient does have callus present located at the distal tuft of the right 3rd toe causing pain.   Objective:  Physical Exam: warm, good capillary refill nail exam onychomycosis of the toenails, onycholysis, and dystrophic nails DP pulses palpable, PT pulses palpable, and protective sensation intact Left Foot:  Pain with palpation of nails due to elongation and dystrophic growth.  Right Foot: Pain with palpation of nails due to elongation and dystrophic growth.  Hyperkeratotic lesion at the distal tuft plantarly of the third toe on the right foot without underlying ulceration there is a central hyperkeratotic core  Assessment:   1. Pre-ulcerative calluses   2. Porokeratosis   3. Pain due to onychomycosis of toenails of both feet      Plan:  Patient was evaluated and treated and all questions answered.  #Hyperkeratotic lesions/pre ulcerative calluses present distal tuft of the right third toe All symptomatic hyperkeratoses x 1 separate lesions were safely debrided with a sterile #10 blade to patient's level of comfort without incident. We discussed preventative and palliative care of these lesions including supportive and accommodative shoegear, padding, prefabricated and custom molded accommodative orthoses, use of a pumice stone and lotions/creams daily.  #Onychomycosis with pain  -Nails palliatively debrided as below. -Educated on self-care  Procedure: Nail Debridement Rationale: Pain Type of Debridement: manual, sharp  debridement. Instrumentation: Nail nipper, rotary burr. Number of Nails: 10  Return in about 3 months (around 07/24/2022) for RFC.         Everitt Amber, DPM [facility]

## 2022-07-28 ENCOUNTER — Ambulatory Visit: Payer: PPO | Admitting: Podiatry

## 2022-07-30 ENCOUNTER — Ambulatory Visit: Payer: PPO | Admitting: Podiatry

## 2022-08-10 ENCOUNTER — Ambulatory Visit: Payer: PPO | Admitting: Podiatry

## 2022-08-17 ENCOUNTER — Ambulatory Visit: Payer: PPO | Admitting: Podiatry

## 2022-08-17 DIAGNOSIS — B351 Tinea unguium: Secondary | ICD-10-CM

## 2022-08-17 DIAGNOSIS — L84 Corns and callosities: Secondary | ICD-10-CM

## 2022-08-17 DIAGNOSIS — Q828 Other specified congenital malformations of skin: Secondary | ICD-10-CM

## 2022-08-17 DIAGNOSIS — M79676 Pain in unspecified toe(s): Secondary | ICD-10-CM

## 2022-08-17 NOTE — Progress Notes (Signed)
  Subjective:  Patient ID: Barry Weber, male    DOB: May 13, 1927,  MRN: [id number]  Chief Complaint  Patient presents with   Nail Problem    Routine Foot Care- nail trim     89 y.o. male presents with the above complaint. History confirmed with patient. Patient presenting with pain related to dystrophic thickened elongated nails. Patient is unable to trim own nails related to nail dystrophy and/or mobility issues. Patient does not have a history of T2DM.   Objective:  Physical Exam: warm, good capillary refill nail exam onychomycosis of the toenails, onycholysis, and dystrophic nails DP pulses palpable, PT pulses palpable, and protective sensation intact Left Foot:  Pain with palpation of nails due to elongation and dystrophic growth.  Right Foot: Pain with palpation of nails due to elongation and dystrophic growth.  Hyperkeratotic lesion at the distal tuft plantarly of the third toe on the right foot without underlying ulceration there is a central hyperkeratotic core  Assessment:   1. Pre-ulcerative calluses   2. Porokeratosis   3. Pain due to onychomycosis of toenail       Plan:  Patient was evaluated and treated and all questions answered.  #Onychomycosis with pain  -Nails palliatively debrided as below. -Educated on self-care  Procedure: Nail Debridement Rationale: Pain Type of Debridement: manual, sharp debridement. Instrumentation: Nail nipper, rotary burr. Number of Nails: 10  Return in about 3 months (around 11/17/2022) for RFC.         Corinna Gab, DPM [facility]

## 2022-11-30 ENCOUNTER — Ambulatory Visit (INDEPENDENT_AMBULATORY_CARE_PROVIDER_SITE_OTHER): Payer: PPO | Admitting: Podiatry

## 2022-11-30 DIAGNOSIS — Z91199 Patient's noncompliance with other medical treatment and regimen due to unspecified reason: Secondary | ICD-10-CM

## 2022-11-30 NOTE — Progress Notes (Signed)
 Patient absent for apointment

## 2023-03-07 DIAGNOSIS — R1111 Vomiting without nausea: Secondary | ICD-10-CM | POA: Diagnosis not present

## 2023-03-07 DIAGNOSIS — J101 Influenza due to other identified influenza virus with other respiratory manifestations: Secondary | ICD-10-CM | POA: Diagnosis not present

## 2023-03-07 DIAGNOSIS — R531 Weakness: Secondary | ICD-10-CM | POA: Diagnosis not present

## 2023-03-07 DIAGNOSIS — R197 Diarrhea, unspecified: Secondary | ICD-10-CM | POA: Diagnosis not present

## 2023-03-07 DIAGNOSIS — D508 Other iron deficiency anemias: Secondary | ICD-10-CM | POA: Diagnosis not present

## 2023-03-07 DIAGNOSIS — Z20822 Contact with and (suspected) exposure to covid-19: Secondary | ICD-10-CM | POA: Diagnosis not present

## 2023-03-07 DIAGNOSIS — R000 Tachycardia, unspecified: Secondary | ICD-10-CM | POA: Diagnosis not present

## 2023-03-07 DIAGNOSIS — I10 Essential (primary) hypertension: Secondary | ICD-10-CM | POA: Diagnosis not present

## 2023-03-07 DIAGNOSIS — R0689 Other abnormalities of breathing: Secondary | ICD-10-CM | POA: Diagnosis not present

## 2023-03-08 DIAGNOSIS — I517 Cardiomegaly: Secondary | ICD-10-CM | POA: Diagnosis not present

## 2023-03-08 DIAGNOSIS — Z66 Do not resuscitate: Secondary | ICD-10-CM | POA: Diagnosis not present

## 2023-03-08 DIAGNOSIS — R001 Bradycardia, unspecified: Secondary | ICD-10-CM | POA: Diagnosis not present

## 2023-03-08 DIAGNOSIS — I130 Hypertensive heart and chronic kidney disease with heart failure and stage 1 through stage 4 chronic kidney disease, or unspecified chronic kidney disease: Secondary | ICD-10-CM | POA: Diagnosis not present

## 2023-03-08 DIAGNOSIS — I420 Dilated cardiomyopathy: Secondary | ICD-10-CM | POA: Diagnosis not present

## 2023-03-08 DIAGNOSIS — Z85528 Personal history of other malignant neoplasm of kidney: Secondary | ICD-10-CM | POA: Diagnosis not present

## 2023-03-08 DIAGNOSIS — Z87891 Personal history of nicotine dependence: Secondary | ICD-10-CM | POA: Diagnosis not present

## 2023-03-08 DIAGNOSIS — N189 Chronic kidney disease, unspecified: Secondary | ICD-10-CM | POA: Diagnosis not present

## 2023-03-08 DIAGNOSIS — Z79899 Other long term (current) drug therapy: Secondary | ICD-10-CM | POA: Diagnosis not present

## 2023-03-08 DIAGNOSIS — N400 Enlarged prostate without lower urinary tract symptoms: Secondary | ICD-10-CM | POA: Diagnosis not present

## 2023-03-08 DIAGNOSIS — D508 Other iron deficiency anemias: Secondary | ICD-10-CM | POA: Diagnosis not present

## 2023-03-08 DIAGNOSIS — R531 Weakness: Secondary | ICD-10-CM | POA: Diagnosis not present

## 2023-03-08 DIAGNOSIS — Z7982 Long term (current) use of aspirin: Secondary | ICD-10-CM | POA: Diagnosis not present

## 2023-03-08 DIAGNOSIS — K589 Irritable bowel syndrome without diarrhea: Secondary | ICD-10-CM | POA: Diagnosis not present

## 2023-03-08 DIAGNOSIS — I5022 Chronic systolic (congestive) heart failure: Secondary | ICD-10-CM | POA: Diagnosis not present

## 2023-03-08 DIAGNOSIS — J101 Influenza due to other identified influenza virus with other respiratory manifestations: Secondary | ICD-10-CM | POA: Diagnosis not present

## 2023-03-08 DIAGNOSIS — Z905 Acquired absence of kidney: Secondary | ICD-10-CM | POA: Diagnosis not present

## 2023-03-08 DIAGNOSIS — K219 Gastro-esophageal reflux disease without esophagitis: Secondary | ICD-10-CM | POA: Diagnosis not present

## 2023-03-08 DIAGNOSIS — E611 Iron deficiency: Secondary | ICD-10-CM | POA: Diagnosis not present

## 2023-03-08 DIAGNOSIS — I4891 Unspecified atrial fibrillation: Secondary | ICD-10-CM | POA: Diagnosis not present

## 2023-03-08 DIAGNOSIS — Z20822 Contact with and (suspected) exposure to covid-19: Secondary | ICD-10-CM | POA: Diagnosis not present

## 2023-03-08 DIAGNOSIS — R197 Diarrhea, unspecified: Secondary | ICD-10-CM | POA: Diagnosis not present

## 2023-03-08 DIAGNOSIS — F0390 Unspecified dementia without behavioral disturbance: Secondary | ICD-10-CM | POA: Diagnosis not present

## 2023-03-10 DIAGNOSIS — I420 Dilated cardiomyopathy: Secondary | ICD-10-CM | POA: Diagnosis not present

## 2023-03-10 DIAGNOSIS — I5020 Unspecified systolic (congestive) heart failure: Secondary | ICD-10-CM | POA: Diagnosis not present

## 2023-03-10 DIAGNOSIS — Z556 Problems related to health literacy: Secondary | ICD-10-CM | POA: Diagnosis not present

## 2023-03-10 DIAGNOSIS — Z905 Acquired absence of kidney: Secondary | ICD-10-CM | POA: Diagnosis not present

## 2023-03-10 DIAGNOSIS — Z85528 Personal history of other malignant neoplasm of kidney: Secondary | ICD-10-CM | POA: Diagnosis not present

## 2023-03-10 DIAGNOSIS — K219 Gastro-esophageal reflux disease without esophagitis: Secondary | ICD-10-CM | POA: Diagnosis not present

## 2023-03-10 DIAGNOSIS — I4891 Unspecified atrial fibrillation: Secondary | ICD-10-CM | POA: Diagnosis not present

## 2023-03-10 DIAGNOSIS — J1089 Influenza due to other identified influenza virus with other manifestations: Secondary | ICD-10-CM | POA: Diagnosis not present

## 2023-03-10 DIAGNOSIS — N400 Enlarged prostate without lower urinary tract symptoms: Secondary | ICD-10-CM | POA: Diagnosis not present

## 2023-03-10 DIAGNOSIS — F0390 Unspecified dementia without behavioral disturbance: Secondary | ICD-10-CM | POA: Diagnosis not present

## 2023-03-10 DIAGNOSIS — I2510 Atherosclerotic heart disease of native coronary artery without angina pectoris: Secondary | ICD-10-CM | POA: Diagnosis not present

## 2023-03-10 DIAGNOSIS — D509 Iron deficiency anemia, unspecified: Secondary | ICD-10-CM | POA: Diagnosis not present

## 2023-03-10 DIAGNOSIS — N189 Chronic kidney disease, unspecified: Secondary | ICD-10-CM | POA: Diagnosis not present

## 2023-03-10 DIAGNOSIS — I130 Hypertensive heart and chronic kidney disease with heart failure and stage 1 through stage 4 chronic kidney disease, or unspecified chronic kidney disease: Secondary | ICD-10-CM | POA: Diagnosis not present

## 2023-03-10 DIAGNOSIS — Z7982 Long term (current) use of aspirin: Secondary | ICD-10-CM | POA: Diagnosis not present

## 2023-03-10 DIAGNOSIS — Z87891 Personal history of nicotine dependence: Secondary | ICD-10-CM | POA: Diagnosis not present

## 2023-03-16 DIAGNOSIS — J101 Influenza due to other identified influenza virus with other respiratory manifestations: Secondary | ICD-10-CM | POA: Diagnosis not present

## 2023-03-16 DIAGNOSIS — F02B0 Dementia in other diseases classified elsewhere, moderate, without behavioral disturbance, psychotic disturbance, mood disturbance, and anxiety: Secondary | ICD-10-CM | POA: Diagnosis not present

## 2023-03-16 DIAGNOSIS — D6869 Other thrombophilia: Secondary | ICD-10-CM | POA: Diagnosis not present

## 2023-03-16 DIAGNOSIS — G301 Alzheimer's disease with late onset: Secondary | ICD-10-CM | POA: Diagnosis not present

## 2023-03-16 DIAGNOSIS — I4819 Other persistent atrial fibrillation: Secondary | ICD-10-CM | POA: Diagnosis not present

## 2023-03-16 DIAGNOSIS — I420 Dilated cardiomyopathy: Secondary | ICD-10-CM | POA: Diagnosis not present

## 2023-03-16 DIAGNOSIS — I5020 Unspecified systolic (congestive) heart failure: Secondary | ICD-10-CM | POA: Diagnosis not present

## 2023-03-16 DIAGNOSIS — F0390 Unspecified dementia without behavioral disturbance: Secondary | ICD-10-CM | POA: Diagnosis not present

## 2023-03-16 DIAGNOSIS — D692 Other nonthrombocytopenic purpura: Secondary | ICD-10-CM | POA: Diagnosis not present

## 2023-03-16 DIAGNOSIS — R64 Cachexia: Secondary | ICD-10-CM | POA: Diagnosis not present

## 2023-03-16 DIAGNOSIS — D6489 Other specified anemias: Secondary | ICD-10-CM | POA: Diagnosis not present

## 2023-03-16 DIAGNOSIS — F5000 Anorexia nervosa, unspecified: Secondary | ICD-10-CM | POA: Diagnosis not present

## 2023-04-06 DIAGNOSIS — R339 Retention of urine, unspecified: Secondary | ICD-10-CM | POA: Diagnosis not present

## 2023-04-06 DIAGNOSIS — N401 Enlarged prostate with lower urinary tract symptoms: Secondary | ICD-10-CM | POA: Diagnosis not present

## 2023-06-23 DIAGNOSIS — D6489 Other specified anemias: Secondary | ICD-10-CM | POA: Diagnosis not present

## 2023-09-15 DIAGNOSIS — F02B0 Dementia in other diseases classified elsewhere, moderate, without behavioral disturbance, psychotic disturbance, mood disturbance, and anxiety: Secondary | ICD-10-CM | POA: Diagnosis not present

## 2023-09-15 DIAGNOSIS — K2960 Other gastritis without bleeding: Secondary | ICD-10-CM | POA: Diagnosis not present

## 2023-09-15 DIAGNOSIS — F0390 Unspecified dementia without behavioral disturbance: Secondary | ICD-10-CM | POA: Diagnosis not present

## 2023-09-15 DIAGNOSIS — R64 Cachexia: Secondary | ICD-10-CM | POA: Diagnosis not present

## 2023-09-15 DIAGNOSIS — E785 Hyperlipidemia, unspecified: Secondary | ICD-10-CM | POA: Diagnosis not present

## 2023-09-15 DIAGNOSIS — G720 Drug-induced myopathy: Secondary | ICD-10-CM | POA: Diagnosis not present

## 2023-09-15 DIAGNOSIS — Z0000 Encounter for general adult medical examination without abnormal findings: Secondary | ICD-10-CM | POA: Diagnosis not present

## 2023-09-15 DIAGNOSIS — G301 Alzheimer's disease with late onset: Secondary | ICD-10-CM | POA: Diagnosis not present

## 2023-09-15 DIAGNOSIS — I10 Essential (primary) hypertension: Secondary | ICD-10-CM | POA: Diagnosis not present

## 2023-09-15 DIAGNOSIS — K580 Irritable bowel syndrome with diarrhea: Secondary | ICD-10-CM | POA: Diagnosis not present

## 2023-09-15 DIAGNOSIS — Z79899 Other long term (current) drug therapy: Secondary | ICD-10-CM | POA: Diagnosis not present

## 2023-09-15 DIAGNOSIS — F5000 Anorexia nervosa, unspecified: Secondary | ICD-10-CM | POA: Diagnosis not present

## 2023-11-23 DIAGNOSIS — R296 Repeated falls: Secondary | ICD-10-CM | POA: Diagnosis not present

## 2023-11-23 DIAGNOSIS — Z87891 Personal history of nicotine dependence: Secondary | ICD-10-CM | POA: Diagnosis not present

## 2023-11-23 DIAGNOSIS — N3281 Overactive bladder: Secondary | ICD-10-CM | POA: Diagnosis not present

## 2023-11-23 DIAGNOSIS — I161 Hypertensive emergency: Secondary | ICD-10-CM | POA: Diagnosis not present

## 2023-11-23 DIAGNOSIS — R9431 Abnormal electrocardiogram [ECG] [EKG]: Secondary | ICD-10-CM | POA: Diagnosis not present

## 2023-11-23 DIAGNOSIS — J449 Chronic obstructive pulmonary disease, unspecified: Secondary | ICD-10-CM | POA: Diagnosis not present

## 2023-11-23 DIAGNOSIS — Z85528 Personal history of other malignant neoplasm of kidney: Secondary | ICD-10-CM | POA: Diagnosis not present

## 2023-11-23 DIAGNOSIS — I509 Heart failure, unspecified: Secondary | ICD-10-CM | POA: Diagnosis not present

## 2023-11-23 DIAGNOSIS — I480 Paroxysmal atrial fibrillation: Secondary | ICD-10-CM | POA: Diagnosis not present

## 2023-11-23 DIAGNOSIS — Z79899 Other long term (current) drug therapy: Secondary | ICD-10-CM | POA: Diagnosis not present

## 2023-11-23 DIAGNOSIS — E785 Hyperlipidemia, unspecified: Secondary | ICD-10-CM | POA: Diagnosis not present

## 2023-11-23 DIAGNOSIS — R42 Dizziness and giddiness: Secondary | ICD-10-CM | POA: Diagnosis not present

## 2023-11-23 DIAGNOSIS — I4891 Unspecified atrial fibrillation: Secondary | ICD-10-CM | POA: Diagnosis not present

## 2023-11-23 DIAGNOSIS — N400 Enlarged prostate without lower urinary tract symptoms: Secondary | ICD-10-CM | POA: Diagnosis not present

## 2023-11-23 DIAGNOSIS — R7989 Other specified abnormal findings of blood chemistry: Secondary | ICD-10-CM | POA: Diagnosis not present

## 2023-11-23 DIAGNOSIS — I10 Essential (primary) hypertension: Secondary | ICD-10-CM | POA: Diagnosis not present

## 2023-11-23 DIAGNOSIS — N184 Chronic kidney disease, stage 4 (severe): Secondary | ICD-10-CM | POA: Diagnosis not present

## 2023-11-23 DIAGNOSIS — Z87442 Personal history of urinary calculi: Secondary | ICD-10-CM | POA: Diagnosis not present

## 2023-11-23 DIAGNOSIS — I129 Hypertensive chronic kidney disease with stage 1 through stage 4 chronic kidney disease, or unspecified chronic kidney disease: Secondary | ICD-10-CM | POA: Diagnosis not present

## 2023-11-23 DIAGNOSIS — K219 Gastro-esophageal reflux disease without esophagitis: Secondary | ICD-10-CM | POA: Diagnosis not present

## 2023-11-23 DIAGNOSIS — M4802 Spinal stenosis, cervical region: Secondary | ICD-10-CM | POA: Diagnosis not present

## 2023-11-23 DIAGNOSIS — Z905 Acquired absence of kidney: Secondary | ICD-10-CM | POA: Diagnosis not present

## 2023-11-23 DIAGNOSIS — D631 Anemia in chronic kidney disease: Secondary | ICD-10-CM | POA: Diagnosis not present

## 2023-11-23 DIAGNOSIS — M1990 Unspecified osteoarthritis, unspecified site: Secondary | ICD-10-CM | POA: Diagnosis not present

## 2023-11-23 DIAGNOSIS — E43 Unspecified severe protein-calorie malnutrition: Secondary | ICD-10-CM | POA: Diagnosis not present

## 2023-11-24 DIAGNOSIS — I480 Paroxysmal atrial fibrillation: Secondary | ICD-10-CM | POA: Diagnosis not present

## 2023-11-24 DIAGNOSIS — R296 Repeated falls: Secondary | ICD-10-CM | POA: Diagnosis not present

## 2023-11-24 DIAGNOSIS — I129 Hypertensive chronic kidney disease with stage 1 through stage 4 chronic kidney disease, or unspecified chronic kidney disease: Secondary | ICD-10-CM | POA: Diagnosis not present

## 2023-11-24 DIAGNOSIS — R42 Dizziness and giddiness: Secondary | ICD-10-CM | POA: Diagnosis not present

## 2023-11-25 DIAGNOSIS — N184 Chronic kidney disease, stage 4 (severe): Secondary | ICD-10-CM | POA: Diagnosis not present

## 2023-11-25 DIAGNOSIS — Z905 Acquired absence of kidney: Secondary | ICD-10-CM | POA: Diagnosis not present

## 2023-11-25 DIAGNOSIS — E43 Unspecified severe protein-calorie malnutrition: Secondary | ICD-10-CM | POA: Diagnosis not present

## 2023-11-25 DIAGNOSIS — Z79899 Other long term (current) drug therapy: Secondary | ICD-10-CM | POA: Diagnosis not present

## 2023-11-25 DIAGNOSIS — I480 Paroxysmal atrial fibrillation: Secondary | ICD-10-CM | POA: Diagnosis not present

## 2023-11-25 DIAGNOSIS — I10 Essential (primary) hypertension: Secondary | ICD-10-CM | POA: Diagnosis not present

## 2023-11-25 DIAGNOSIS — Z85528 Personal history of other malignant neoplasm of kidney: Secondary | ICD-10-CM | POA: Diagnosis not present

## 2023-11-25 DIAGNOSIS — R0602 Shortness of breath: Secondary | ICD-10-CM | POA: Diagnosis not present

## 2023-11-25 DIAGNOSIS — I4891 Unspecified atrial fibrillation: Secondary | ICD-10-CM | POA: Diagnosis not present

## 2023-11-25 DIAGNOSIS — J449 Chronic obstructive pulmonary disease, unspecified: Secondary | ICD-10-CM | POA: Diagnosis not present

## 2023-11-25 DIAGNOSIS — R55 Syncope and collapse: Secondary | ICD-10-CM | POA: Diagnosis not present

## 2023-11-25 DIAGNOSIS — I252 Old myocardial infarction: Secondary | ICD-10-CM | POA: Diagnosis not present

## 2023-11-25 DIAGNOSIS — M1990 Unspecified osteoarthritis, unspecified site: Secondary | ICD-10-CM | POA: Diagnosis not present

## 2023-11-25 DIAGNOSIS — R7989 Other specified abnormal findings of blood chemistry: Secondary | ICD-10-CM | POA: Diagnosis not present

## 2023-11-25 DIAGNOSIS — R42 Dizziness and giddiness: Secondary | ICD-10-CM | POA: Diagnosis not present

## 2023-11-25 DIAGNOSIS — Z87891 Personal history of nicotine dependence: Secondary | ICD-10-CM | POA: Diagnosis not present

## 2023-11-25 DIAGNOSIS — D631 Anemia in chronic kidney disease: Secondary | ICD-10-CM | POA: Diagnosis not present

## 2023-11-25 DIAGNOSIS — Z681 Body mass index (BMI) 19 or less, adult: Secondary | ICD-10-CM | POA: Diagnosis not present

## 2023-11-25 DIAGNOSIS — R9431 Abnormal electrocardiogram [ECG] [EKG]: Secondary | ICD-10-CM | POA: Diagnosis not present

## 2023-11-25 DIAGNOSIS — F0390 Unspecified dementia without behavioral disturbance: Secondary | ICD-10-CM | POA: Diagnosis not present

## 2023-11-25 DIAGNOSIS — I444 Left anterior fascicular block: Secondary | ICD-10-CM | POA: Diagnosis not present

## 2023-11-25 DIAGNOSIS — K219 Gastro-esophageal reflux disease without esophagitis: Secondary | ICD-10-CM | POA: Diagnosis not present

## 2023-11-25 DIAGNOSIS — Z87442 Personal history of urinary calculi: Secondary | ICD-10-CM | POA: Diagnosis not present

## 2023-11-26 DIAGNOSIS — R7989 Other specified abnormal findings of blood chemistry: Secondary | ICD-10-CM | POA: Diagnosis not present

## 2023-11-26 DIAGNOSIS — N184 Chronic kidney disease, stage 4 (severe): Secondary | ICD-10-CM | POA: Diagnosis not present

## 2023-11-27 DIAGNOSIS — N184 Chronic kidney disease, stage 4 (severe): Secondary | ICD-10-CM | POA: Diagnosis not present

## 2023-11-27 DIAGNOSIS — R7989 Other specified abnormal findings of blood chemistry: Secondary | ICD-10-CM | POA: Diagnosis not present

## 2023-11-29 DIAGNOSIS — F02B0 Dementia in other diseases classified elsewhere, moderate, without behavioral disturbance, psychotic disturbance, mood disturbance, and anxiety: Secondary | ICD-10-CM | POA: Diagnosis not present

## 2023-11-29 DIAGNOSIS — K5790 Diverticulosis of intestine, part unspecified, without perforation or abscess without bleeding: Secondary | ICD-10-CM | POA: Diagnosis not present

## 2023-11-29 DIAGNOSIS — I420 Dilated cardiomyopathy: Secondary | ICD-10-CM | POA: Diagnosis not present

## 2023-11-29 DIAGNOSIS — D6869 Other thrombophilia: Secondary | ICD-10-CM | POA: Diagnosis not present

## 2023-11-29 DIAGNOSIS — D631 Anemia in chronic kidney disease: Secondary | ICD-10-CM | POA: Diagnosis not present

## 2023-11-29 DIAGNOSIS — E785 Hyperlipidemia, unspecified: Secondary | ICD-10-CM | POA: Diagnosis not present

## 2023-11-29 DIAGNOSIS — G8929 Other chronic pain: Secondary | ICD-10-CM | POA: Diagnosis not present

## 2023-11-29 DIAGNOSIS — G2581 Restless legs syndrome: Secondary | ICD-10-CM | POA: Diagnosis not present

## 2023-11-29 DIAGNOSIS — I4819 Other persistent atrial fibrillation: Secondary | ICD-10-CM | POA: Diagnosis not present

## 2023-11-29 DIAGNOSIS — N390 Urinary tract infection, site not specified: Secondary | ICD-10-CM | POA: Diagnosis not present

## 2023-11-29 DIAGNOSIS — E43 Unspecified severe protein-calorie malnutrition: Secondary | ICD-10-CM | POA: Diagnosis not present

## 2023-11-29 DIAGNOSIS — N184 Chronic kidney disease, stage 4 (severe): Secondary | ICD-10-CM | POA: Diagnosis not present

## 2023-11-29 DIAGNOSIS — I130 Hypertensive heart and chronic kidney disease with heart failure and stage 1 through stage 4 chronic kidney disease, or unspecified chronic kidney disease: Secondary | ICD-10-CM | POA: Diagnosis not present

## 2023-11-29 DIAGNOSIS — K219 Gastro-esophageal reflux disease without esophagitis: Secondary | ICD-10-CM | POA: Diagnosis not present

## 2023-11-29 DIAGNOSIS — N400 Enlarged prostate without lower urinary tract symptoms: Secondary | ICD-10-CM | POA: Diagnosis not present

## 2023-11-29 DIAGNOSIS — M1990 Unspecified osteoarthritis, unspecified site: Secondary | ICD-10-CM | POA: Diagnosis not present

## 2023-11-29 DIAGNOSIS — G301 Alzheimer's disease with late onset: Secondary | ICD-10-CM | POA: Diagnosis not present

## 2023-11-29 DIAGNOSIS — F5000 Anorexia nervosa, unspecified: Secondary | ICD-10-CM | POA: Diagnosis not present

## 2023-11-29 DIAGNOSIS — J449 Chronic obstructive pulmonary disease, unspecified: Secondary | ICD-10-CM | POA: Diagnosis not present

## 2023-11-29 DIAGNOSIS — N281 Cyst of kidney, acquired: Secondary | ICD-10-CM | POA: Diagnosis not present

## 2023-11-29 DIAGNOSIS — I5022 Chronic systolic (congestive) heart failure: Secondary | ICD-10-CM | POA: Diagnosis not present

## 2023-11-29 DIAGNOSIS — D692 Other nonthrombocytopenic purpura: Secondary | ICD-10-CM | POA: Diagnosis not present

## 2023-11-29 DIAGNOSIS — E538 Deficiency of other specified B group vitamins: Secondary | ICD-10-CM | POA: Diagnosis not present

## 2023-11-29 DIAGNOSIS — I700 Atherosclerosis of aorta: Secondary | ICD-10-CM | POA: Diagnosis not present

## 2023-11-30 DIAGNOSIS — I4819 Other persistent atrial fibrillation: Secondary | ICD-10-CM | POA: Diagnosis not present

## 2023-11-30 DIAGNOSIS — I5020 Unspecified systolic (congestive) heart failure: Secondary | ICD-10-CM | POA: Diagnosis not present

## 2023-11-30 DIAGNOSIS — Z2821 Immunization not carried out because of patient refusal: Secondary | ICD-10-CM | POA: Diagnosis not present

## 2023-11-30 DIAGNOSIS — Z6820 Body mass index (BMI) 20.0-20.9, adult: Secondary | ICD-10-CM | POA: Diagnosis not present

## 2023-11-30 DIAGNOSIS — I10 Essential (primary) hypertension: Secondary | ICD-10-CM | POA: Diagnosis not present

## 2023-11-30 DIAGNOSIS — I420 Dilated cardiomyopathy: Secondary | ICD-10-CM | POA: Diagnosis not present

## 2023-12-15 DIAGNOSIS — I130 Hypertensive heart and chronic kidney disease with heart failure and stage 1 through stage 4 chronic kidney disease, or unspecified chronic kidney disease: Secondary | ICD-10-CM | POA: Diagnosis not present

## 2023-12-15 DIAGNOSIS — I4819 Other persistent atrial fibrillation: Secondary | ICD-10-CM | POA: Diagnosis not present

## 2023-12-20 DIAGNOSIS — I4819 Other persistent atrial fibrillation: Secondary | ICD-10-CM | POA: Diagnosis not present

## 2023-12-20 DIAGNOSIS — R030 Elevated blood-pressure reading, without diagnosis of hypertension: Secondary | ICD-10-CM | POA: Diagnosis not present

## 2023-12-29 DIAGNOSIS — Z87442 Personal history of urinary calculi: Secondary | ICD-10-CM | POA: Diagnosis not present

## 2023-12-29 DIAGNOSIS — Z87891 Personal history of nicotine dependence: Secondary | ICD-10-CM | POA: Diagnosis not present

## 2023-12-29 DIAGNOSIS — N184 Chronic kidney disease, stage 4 (severe): Secondary | ICD-10-CM | POA: Diagnosis not present

## 2023-12-29 DIAGNOSIS — Z7901 Long term (current) use of anticoagulants: Secondary | ICD-10-CM | POA: Diagnosis not present

## 2023-12-29 DIAGNOSIS — N400 Enlarged prostate without lower urinary tract symptoms: Secondary | ICD-10-CM | POA: Diagnosis not present

## 2023-12-29 DIAGNOSIS — Z905 Acquired absence of kidney: Secondary | ICD-10-CM | POA: Diagnosis not present

## 2023-12-29 DIAGNOSIS — Z681 Body mass index (BMI) 19 or less, adult: Secondary | ICD-10-CM | POA: Diagnosis not present

## 2023-12-29 DIAGNOSIS — J9811 Atelectasis: Secondary | ICD-10-CM | POA: Diagnosis not present

## 2023-12-29 DIAGNOSIS — R296 Repeated falls: Secondary | ICD-10-CM | POA: Diagnosis not present

## 2023-12-29 DIAGNOSIS — I5033 Acute on chronic diastolic (congestive) heart failure: Secondary | ICD-10-CM | POA: Diagnosis not present

## 2023-12-29 DIAGNOSIS — Z85528 Personal history of other malignant neoplasm of kidney: Secondary | ICD-10-CM | POA: Diagnosis not present

## 2023-12-29 DIAGNOSIS — N3281 Overactive bladder: Secondary | ICD-10-CM | POA: Diagnosis not present

## 2023-12-29 DIAGNOSIS — J90 Pleural effusion, not elsewhere classified: Secondary | ICD-10-CM | POA: Diagnosis not present

## 2023-12-29 DIAGNOSIS — Z79891 Long term (current) use of opiate analgesic: Secondary | ICD-10-CM | POA: Diagnosis not present

## 2023-12-29 DIAGNOSIS — I10 Essential (primary) hypertension: Secondary | ICD-10-CM | POA: Diagnosis not present

## 2023-12-29 DIAGNOSIS — N189 Chronic kidney disease, unspecified: Secondary | ICD-10-CM | POA: Diagnosis not present

## 2023-12-29 DIAGNOSIS — Z9181 History of falling: Secondary | ICD-10-CM | POA: Diagnosis not present

## 2023-12-29 DIAGNOSIS — D631 Anemia in chronic kidney disease: Secondary | ICD-10-CM | POA: Diagnosis not present

## 2023-12-29 DIAGNOSIS — K219 Gastro-esophageal reflux disease without esophagitis: Secondary | ICD-10-CM | POA: Diagnosis not present

## 2023-12-29 DIAGNOSIS — J449 Chronic obstructive pulmonary disease, unspecified: Secondary | ICD-10-CM | POA: Diagnosis not present

## 2023-12-29 DIAGNOSIS — I130 Hypertensive heart and chronic kidney disease with heart failure and stage 1 through stage 4 chronic kidney disease, or unspecified chronic kidney disease: Secondary | ICD-10-CM | POA: Diagnosis not present

## 2023-12-29 DIAGNOSIS — N179 Acute kidney failure, unspecified: Secondary | ICD-10-CM | POA: Diagnosis not present

## 2023-12-29 DIAGNOSIS — E43 Unspecified severe protein-calorie malnutrition: Secondary | ICD-10-CM | POA: Diagnosis not present

## 2023-12-29 DIAGNOSIS — F03A0 Unspecified dementia, mild, without behavioral disturbance, psychotic disturbance, mood disturbance, and anxiety: Secondary | ICD-10-CM | POA: Diagnosis not present

## 2023-12-29 DIAGNOSIS — I509 Heart failure, unspecified: Secondary | ICD-10-CM | POA: Diagnosis not present

## 2023-12-29 DIAGNOSIS — I4819 Other persistent atrial fibrillation: Secondary | ICD-10-CM | POA: Diagnosis not present

## 2023-12-29 DIAGNOSIS — I4891 Unspecified atrial fibrillation: Secondary | ICD-10-CM | POA: Diagnosis not present

## 2023-12-29 DIAGNOSIS — Z79899 Other long term (current) drug therapy: Secondary | ICD-10-CM | POA: Diagnosis not present

## 2023-12-29 DIAGNOSIS — M1990 Unspecified osteoarthritis, unspecified site: Secondary | ICD-10-CM | POA: Diagnosis not present

## 2024-01-04 ENCOUNTER — Telehealth: Payer: Self-pay

## 2024-01-04 NOTE — Transitions of Care (Post Inpatient/ED Visit) (Signed)
   01/04/2024  Name: Braddock Servellon MRN: [id number] DOB: 1927-03-14  Today's TOC FU Call Status: Today's TOC FU Call Status:: Unsuccessful Call (1st Attempt) Unsuccessful Call (1st Attempt) Date: 01/04/24  Attempted to reach the patient regarding the most recent Inpatient/ED visit.  Follow Up Plan: Additional outreach attempts will be made to reach the patient to complete the Transitions of Care (Post Inpatient/ED visit) call.   Shona Prow RN, CCM [place]  [facility]Population [facility] RN Care Manager [phone]

## 2024-01-04 NOTE — Transitions of Care (Post Inpatient/ED Visit) (Signed)
   01/04/2024  Name: Barry Weber MRN: [id number] DOB: 06-24-1927  Today's TOC FU Call Status: Today's TOC FU Call Status:: Successful TOC FU Call Completed TOC FU Call Complete Date: 01/04/24 [facility] RN received VM from Daughter, Particia - called back and spoke with son in law who states home health nurse was just there today and denied need for [facility] call/program)  Patient's Name and Date of Birth confirmed. DOB, Name  Shona Prow RN, CCM [place]  [facility]BC[facility]-Population Health RN Care Manager [phone]

## 2024-01-10 DIAGNOSIS — I4819 Other persistent atrial fibrillation: Secondary | ICD-10-CM | POA: Diagnosis not present

## 2024-01-10 DIAGNOSIS — I10 Essential (primary) hypertension: Secondary | ICD-10-CM | POA: Diagnosis not present

## 2024-01-10 DIAGNOSIS — Z6820 Body mass index (BMI) 20.0-20.9, adult: Secondary | ICD-10-CM | POA: Diagnosis not present

## 2024-01-10 DIAGNOSIS — Z85528 Personal history of other malignant neoplasm of kidney: Secondary | ICD-10-CM | POA: Diagnosis not present

## 2024-01-10 DIAGNOSIS — I5020 Unspecified systolic (congestive) heart failure: Secondary | ICD-10-CM | POA: Diagnosis not present

## 2024-01-10 DIAGNOSIS — N184 Chronic kidney disease, stage 4 (severe): Secondary | ICD-10-CM | POA: Diagnosis not present

## 2024-01-12 DIAGNOSIS — I4819 Other persistent atrial fibrillation: Secondary | ICD-10-CM | POA: Diagnosis not present
